# Patient Record
Sex: Female | Born: 1965 | Race: White | Hispanic: No | Marital: Married | Smoking: Former smoker
Health system: Southern US, Community
[De-identification: ages and names within clinical notes are randomized; demographics above are authoritative.]

## PROBLEM LIST (undated history)

## (undated) DIAGNOSIS — E162 Hypoglycemia, unspecified: Secondary | ICD-10-CM

## (undated) DIAGNOSIS — G8220 Paraplegia, unspecified: Secondary | ICD-10-CM

## (undated) HISTORY — DX: Paraplegia, unspecified: G82.20

## (undated) HISTORY — PX: BREAST LUMPECTOMY: SHX2

## (undated) HISTORY — PX: PARTIAL HYSTERECTOMY: SHX80

## (undated) HISTORY — PX: WISDOM TOOTH EXTRACTION: SHX21

## (undated) HISTORY — PX: SPINAL FIXATION SURGERY: SHX1055

## (undated) HISTORY — PX: THORACOTOMY: SUR1349

## (undated) HISTORY — PX: CARPAL TUNNEL RELEASE: SHX101

## (undated) HISTORY — PX: KNEE ARTHROPLASTY: SHX992

## (undated) HISTORY — PX: TRIGGER FINGER RELEASE: SHX641

## (undated) HISTORY — DX: Hypoglycemia, unspecified: E16.2

## (undated) HISTORY — PX: HIP ARTHROSCOPY W/ LABRAL REPAIR: SHX1750

## (undated) HISTORY — PX: ROTATOR CUFF REPAIR: SHX139

## (undated) HISTORY — PX: ABDOMINAL SURGERY: SHX537

## (undated) HISTORY — PX: TONSILECTOMY/ADENOIDECTOMY WITH MYRINGOTOMY: SHX6125

---

## 2012-06-26 DIAGNOSIS — Z96659 Presence of unspecified artificial knee joint: Secondary | ICD-10-CM | POA: Insufficient documentation

## 2012-11-19 DIAGNOSIS — M9320 Osteochondritis dissecans of unspecified site: Secondary | ICD-10-CM | POA: Insufficient documentation

## 2013-03-11 DIAGNOSIS — M1712 Unilateral primary osteoarthritis, left knee: Secondary | ICD-10-CM | POA: Insufficient documentation

## 2013-03-11 DIAGNOSIS — M25562 Pain in left knee: Secondary | ICD-10-CM | POA: Insufficient documentation

## 2013-03-11 DIAGNOSIS — M21169 Varus deformity, not elsewhere classified, unspecified knee: Secondary | ICD-10-CM | POA: Insufficient documentation

## 2014-03-25 DIAGNOSIS — M1990 Unspecified osteoarthritis, unspecified site: Secondary | ICD-10-CM | POA: Insufficient documentation

## 2014-04-08 DIAGNOSIS — Z96652 Presence of left artificial knee joint: Secondary | ICD-10-CM | POA: Insufficient documentation

## 2014-07-15 DIAGNOSIS — M25511 Pain in right shoulder: Secondary | ICD-10-CM | POA: Insufficient documentation

## 2015-05-27 DIAGNOSIS — M25552 Pain in left hip: Secondary | ICD-10-CM | POA: Insufficient documentation

## 2015-05-27 DIAGNOSIS — M7050 Other bursitis of knee, unspecified knee: Secondary | ICD-10-CM | POA: Insufficient documentation

## 2016-07-18 DIAGNOSIS — Z471 Aftercare following joint replacement surgery: Secondary | ICD-10-CM | POA: Insufficient documentation

## 2018-09-10 DIAGNOSIS — M19041 Primary osteoarthritis, right hand: Secondary | ICD-10-CM | POA: Insufficient documentation

## 2019-03-02 DIAGNOSIS — T1490XA Injury, unspecified, initial encounter: Secondary | ICD-10-CM | POA: Insufficient documentation

## 2019-03-02 DIAGNOSIS — S22049D Unspecified fracture of fourth thoracic vertebra, subsequent encounter for fracture with routine healing: Secondary | ICD-10-CM | POA: Insufficient documentation

## 2019-03-03 DIAGNOSIS — S22039A Unspecified fracture of third thoracic vertebra, initial encounter for closed fracture: Secondary | ICD-10-CM | POA: Insufficient documentation

## 2019-03-03 DIAGNOSIS — S2242XA Multiple fractures of ribs, left side, initial encounter for closed fracture: Secondary | ICD-10-CM | POA: Insufficient documentation

## 2019-03-13 DIAGNOSIS — R5381 Other malaise: Secondary | ICD-10-CM | POA: Insufficient documentation

## 2019-03-25 DIAGNOSIS — Z981 Arthrodesis status: Secondary | ICD-10-CM | POA: Insufficient documentation

## 2019-04-19 DIAGNOSIS — G8220 Paraplegia, unspecified: Secondary | ICD-10-CM | POA: Insufficient documentation

## 2019-05-20 DIAGNOSIS — R339 Retention of urine, unspecified: Secondary | ICD-10-CM | POA: Insufficient documentation

## 2019-10-28 ENCOUNTER — Encounter
Payer: BC Managed Care – PPO | Attending: Physical Medicine and Rehabilitation | Admitting: Physical Medicine and Rehabilitation

## 2019-10-28 ENCOUNTER — Other Ambulatory Visit: Payer: Self-pay

## 2019-10-28 ENCOUNTER — Encounter: Payer: Self-pay | Admitting: Physical Medicine and Rehabilitation

## 2019-10-28 VITALS — BP 109/74 | HR 102 | Temp 97.7°F | Ht 66.0 in | Wt 140.0 lb

## 2019-10-28 DIAGNOSIS — R252 Cramp and spasm: Secondary | ICD-10-CM | POA: Insufficient documentation

## 2019-10-28 DIAGNOSIS — M7918 Myalgia, other site: Secondary | ICD-10-CM | POA: Diagnosis present

## 2019-10-28 DIAGNOSIS — G8220 Paraplegia, unspecified: Secondary | ICD-10-CM | POA: Diagnosis present

## 2019-10-28 DIAGNOSIS — Z9359 Other cystostomy status: Secondary | ICD-10-CM | POA: Insufficient documentation

## 2019-10-28 DIAGNOSIS — N319 Neuromuscular dysfunction of bladder, unspecified: Secondary | ICD-10-CM | POA: Diagnosis present

## 2019-10-28 DIAGNOSIS — G8221 Paraplegia, complete: Secondary | ICD-10-CM | POA: Insufficient documentation

## 2019-10-28 DIAGNOSIS — K592 Neurogenic bowel, not elsewhere classified: Secondary | ICD-10-CM | POA: Diagnosis present

## 2019-10-28 MED ORDER — LIDOCAINE 5 % EX PTCH: 3.0000 | MEDICATED_PATCH | Freq: Two times a day (BID) | CUTANEOUS | 5 refills | Status: AC

## 2019-10-28 MED ORDER — DULOXETINE HCL 60 MG PO CPEP: 60.0000 mg | ORAL_CAPSULE | Freq: Every day | ORAL | 5 refills | Status: DC
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
## 2019-11-01 ENCOUNTER — Encounter (HOSPITAL_BASED_OUTPATIENT_CLINIC_OR_DEPARTMENT_OTHER): Payer: BC Managed Care – PPO | Admitting: Physical Medicine and Rehabilitation

## 2019-11-01 ENCOUNTER — Other Ambulatory Visit: Payer: Self-pay

## 2019-11-01 ENCOUNTER — Encounter: Payer: Self-pay | Admitting: Physical Medicine and Rehabilitation

## 2019-11-01 VITALS — BP 123/75 | HR 78 | Temp 97.7°F

## 2019-11-01 DIAGNOSIS — G8220 Paraplegia, unspecified: Secondary | ICD-10-CM | POA: Diagnosis not present

## 2019-11-01 DIAGNOSIS — M7918 Myalgia, other site: Secondary | ICD-10-CM | POA: Diagnosis not present
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
## 2019-11-01 NOTE — Patient Instructions (Signed)
Injected 6cc Using 1% Lidocaine with no EPI  Upper traps  B/L Levators B/L Posterior scalenes L only Middle scalenes Splenius Capitus Pectoralis Major Rhomboids B/L Infraspinatus Teres Major/minor Thoracic paraspinals x3 on each side Lumbar paraspinals Other injections-    Patient's level of pain prior was 3/10 Current level of pain after injections is- neck moves a little better- pain about the same  There was no bleeding or complications.  Patient was advised to drink a lot of water on day after injections to flush system Will have increased soreness for 12-48 hours after injections.  Can use Lidocaine patches the day AFTER injections Can use theracane on day of injections in places didn't inject Can use heating pad or cool/pad 4-6 hours AFTER injections - Discussed myofascial release for pecs- pulling up and inwards at different times.  - Order theracane- tennis balls. Can use 2-4 minutes against back of w/c.   -f/u 4 weeks

## 2019-11-29 ENCOUNTER — Encounter: Payer: BC Managed Care – PPO | Admitting: Physical Medicine and Rehabilitation

## 2019-12-04 ENCOUNTER — Other Ambulatory Visit: Payer: Self-pay

## 2019-12-04 ENCOUNTER — Encounter
Payer: BC Managed Care – PPO | Attending: Physical Medicine and Rehabilitation | Admitting: Physical Medicine and Rehabilitation

## 2019-12-04 ENCOUNTER — Encounter: Payer: Self-pay | Admitting: Physical Medicine and Rehabilitation

## 2019-12-04 VITALS — BP 117/79 | HR 103 | Temp 97.2°F | Wt 150.0 lb

## 2019-12-04 DIAGNOSIS — R252 Cramp and spasm: Secondary | ICD-10-CM

## 2019-12-04 DIAGNOSIS — K592 Neurogenic bowel, not elsewhere classified: Secondary | ICD-10-CM | POA: Diagnosis present

## 2019-12-04 DIAGNOSIS — M7918 Myalgia, other site: Secondary | ICD-10-CM | POA: Diagnosis present

## 2019-12-04 DIAGNOSIS — G950 Syringomyelia and syringobulbia: Secondary | ICD-10-CM | POA: Diagnosis not present

## 2019-12-04 DIAGNOSIS — N319 Neuromuscular dysfunction of bladder, unspecified: Secondary | ICD-10-CM

## 2019-12-04 DIAGNOSIS — G8220 Paraplegia, unspecified: Secondary | ICD-10-CM | POA: Diagnosis present

## 2019-12-04 DIAGNOSIS — Z9359 Other cystostomy status: Secondary | ICD-10-CM

## 2019-12-04 MED ORDER — BACLOFEN 20 MG PO TABS: 40.0000 mg | ORAL_TABLET | Freq: Four times a day (QID) | ORAL | 11 refills | Status: DC

## 2019-12-04 NOTE — Patient Instructions (Signed)
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   1. MRI of cervical and thoracic and lumbar with and without contrast of spinal cord to look for syrinx- went over my concerns and concerned about compression  Vs.  Syrinx. Esp due to abrupt worsening of Sxs' explained trigger point injections are NOT cause- will fade within 1 day at most.      2. Call me if any concerns- and ask for [facility] if need me immediately.  3. Can increase Baclofen up to 160 mg/day- max- so that gives 40 mg (20 mg can be split to 2 -10mg - so basically 4 extra pills/day as needed) in addition to normal dosing.  - will wait on adding Zanaflex for increased spasticity.   4. Keens' shoes- for enlarged toe box.   5. Try 1/2 glass wine first, then can increase to max of 1 glass of wine at a time.   6. F/U 3-4 weeks to make sure nothing gets lost.
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
## 2019-12-06 ENCOUNTER — Telehealth: Payer: Self-pay | Admitting: *Deleted

## 2019-12-06 NOTE — Telephone Encounter (Signed)
Ms Bracey called and said they cannot get her in for the MRI until the 29th.  She is wondering if she should keep that appt or if it should be scheduled somewhere else. Please advise.

## 2019-12-17 NOTE — Telephone Encounter (Signed)
I would keep 1/29 unless her symptoms get substantially worse, then please call back and will try to get her in somewhere else earlier.  Thank you

## 2019-12-20 ENCOUNTER — Other Ambulatory Visit (HOSPITAL_COMMUNITY): Payer: BC Managed Care – PPO

## 2019-12-20 ENCOUNTER — Ambulatory Visit (HOSPITAL_COMMUNITY): Payer: BC Managed Care – PPO

## 2019-12-20 ENCOUNTER — Ambulatory Visit: Payer: BC Managed Care – PPO | Admitting: Physical Medicine and Rehabilitation

## 2019-12-23 ENCOUNTER — Telehealth: Payer: Self-pay | Admitting: *Deleted

## 2019-12-23 MED ORDER — DIAZEPAM 10 MG PO TABS: ORAL_TABLET | ORAL | 0 refills | Status: DC

## 2019-12-23 NOTE — Telephone Encounter (Signed)
Patient left a message stating that after further review she would like a sedative for her upcoming procedure (MRI).  She was told it would be a 3 hour procedure.  I spoke with Dr. Berline Chough, she agreed.  I notified patient and phoned I 1 tablet of diazepam 10 mg to patients preferred pharmacy.

## 2019-12-24 ENCOUNTER — Encounter (HOSPITAL_COMMUNITY): Payer: Self-pay

## 2019-12-24 ENCOUNTER — Ambulatory Visit (HOSPITAL_COMMUNITY)
Admission: RE | Admit: 2019-12-24 | Discharge: 2019-12-24 | Disposition: A | Discharge status: Home / Self Care | Payer: BC Managed Care – PPO | Source: Ambulatory Visit | Attending: Physical Medicine and Rehabilitation | Admitting: Physical Medicine and Rehabilitation

## 2019-12-24 ENCOUNTER — Other Ambulatory Visit: Payer: Self-pay

## 2019-12-24 ENCOUNTER — Ambulatory Visit (HOSPITAL_COMMUNITY): Payer: BC Managed Care – PPO

## 2019-12-24 DIAGNOSIS — G950 Syringomyelia and syringobulbia: Secondary | ICD-10-CM | POA: Diagnosis not present

## 2019-12-24 MED ORDER — GADOBUTROL 1 MMOL/ML IV SOLN
7.5000 mL | Freq: Once | INTRAVENOUS | Status: AC | PRN
  Administered 2019-12-24: 7.5 mL via INTRAVENOUS

## 2019-12-27 ENCOUNTER — Other Ambulatory Visit: Payer: Self-pay

## 2019-12-27 ENCOUNTER — Telehealth: Payer: Self-pay

## 2019-12-27 ENCOUNTER — Encounter
Payer: BC Managed Care – PPO | Attending: Physical Medicine and Rehabilitation | Admitting: Physical Medicine and Rehabilitation

## 2019-12-27 ENCOUNTER — Encounter: Payer: Self-pay | Admitting: Physical Medicine and Rehabilitation

## 2019-12-27 VITALS — BP 117/79 | HR 96 | Temp 97.7°F

## 2019-12-27 DIAGNOSIS — K592 Neurogenic bowel, not elsewhere classified: Secondary | ICD-10-CM | POA: Diagnosis present

## 2019-12-27 DIAGNOSIS — N319 Neuromuscular dysfunction of bladder, unspecified: Secondary | ICD-10-CM | POA: Insufficient documentation

## 2019-12-27 DIAGNOSIS — M7918 Myalgia, other site: Secondary | ICD-10-CM | POA: Insufficient documentation

## 2019-12-27 DIAGNOSIS — G8220 Paraplegia, unspecified: Secondary | ICD-10-CM | POA: Diagnosis present

## 2019-12-27 DIAGNOSIS — Z9359 Other cystostomy status: Secondary | ICD-10-CM | POA: Diagnosis present

## 2019-12-27 DIAGNOSIS — G950 Syringomyelia and syringobulbia: Secondary | ICD-10-CM | POA: Insufficient documentation

## 2019-12-27 DIAGNOSIS — R252 Cramp and spasm: Secondary | ICD-10-CM | POA: Diagnosis present

## 2019-12-27 DIAGNOSIS — M792 Neuralgia and neuritis, unspecified: Secondary | ICD-10-CM

## 2019-12-27 MED ORDER — PREGABALIN 50 MG PO CAPS: 50.0000 mg | ORAL_CAPSULE | Freq: Two times a day (BID) | ORAL | 0 refills | Status: DC

## 2019-12-27 MED ORDER — PREGABALIN 50 MG PO CAPS: 50.0000 mg | ORAL_CAPSULE | Freq: Two times a day (BID) | ORAL | 3 refills | Status: DC

## 2019-12-27 MED ORDER — PREGABALIN 100 MG PO CAPS: 100.0000 mg | ORAL_CAPSULE | Freq: Two times a day (BID) | ORAL | 3 refills | Status: DC
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
## 2019-12-27 NOTE — Telephone Encounter (Signed)
Lyrica, insurance only allows max 3 tablets daily.

## 2019-12-27 NOTE — Addendum Note (Signed)
Addended by: Genice Rouge on: 12/27/2019 03:07 PM   Modules accepted: Orders

## 2019-12-27 NOTE — Telephone Encounter (Signed)
I did it- thanks

## 2019-12-27 NOTE — Progress Notes (Signed)
Subjective:    Patient ID: Angela Gilmore, adult    DOB: 03-28-1966, 54 y.o.   MRN: [id number]  HPI  Started 6 weeks ago: Rectangle of pain that starts in upper neck/back area below C7 and extends down to below scapulae.  If keeps pressure on it, it extends down legs to bottom of feet.   Will draw up around front of abdomen and under breasts.    Can't even lay on back in bed anymore.    3 episodes where heart rate has gone up to 160.   MRI results gone over with pt and husband and except for SCI, no issues   Has come off gabapentin- off gabapentin completely now. Cymbalta is now 60 mg daily- (30 to 60 mg now) Was on Gabapentin  Was 400 mg 3x/day   Tramadol doesn't help these symptoms that's she's having.   [facility] did power w/c.       Pain Inventory Average Pain 7 Pain Right Now 5 My pain is constant  In the last 24 hours, has pain interfered with the following? General activity 7 Relation with others 7 Enjoyment of life 0 What TIME of day is your pain at its worst? all Sleep (in general) Fair  Pain is worse with: unsure Pain improves with: medication Relief from Meds: 1  Mobility do you drive?  no use a wheelchair needs help with transfers Do you have any goals in this area?  yes  Function disabled: date disabled . I need assistance with the following:  dressing, bathing, toileting, meal prep, household duties and shopping  Neuro/Psych numbness spasms  Prior Studies Any changes since last visit?  no  Physicians involved in your care Any changes since last visit?  no   Family History  Problem Relation Age of Onset  . Heart disease Father   . Emphysema Father   . Breast cancer Sister   . Mental illness Sister   . Stroke Brother   . Mental illness Brother    Social History   Socioeconomic History  . Marital status: Unknown    Spouse name: Not on file  . Number of children: Not on file  . Years of education: Not on file    . Highest education level: Not on file  Occupational History  . Not on file  Tobacco Use  . Smoking status: Former Games developer  . Smokeless tobacco: Never Used  Substance and Sexual Activity  . Alcohol use: Not Currently  . Drug use: Never  . Sexual activity: Not on file  Other Topics Concern  . Not on file  Social History Narrative  . Not on file   Social Determinants of Health   Financial Resource Strain:   . Difficulty of Paying Living Expenses: Not on file  Food Insecurity:   . Worried About Programme researcher, broadcasting/film/video in the Last Year: Not on file  . Ran Out of Food in the Last Year: Not on file  Transportation Needs:   . Lack of Transportation (Medical): Not on file  . Lack of Transportation (Non-Medical): Not on file  Physical Activity:   . Days of Exercise per Week: Not on file  . Minutes of Exercise per Session: Not on file  Stress:   . Feeling of Stress : Not on file  Social Connections:   . Frequency of Communication with Friends and Family: Not on file  . Frequency of Social Gatherings with Friends and Family: Not on file  . Attends  Religious Services: Not on file  . Active Member of Clubs or Organizations: Not on file  . Attends Archivist Meetings: Not on file  . Marital Status: Not on file   Past Surgical History:  Procedure Laterality Date  . ABDOMINAL SURGERY    . BREAST LUMPECTOMY    . CARPAL TUNNEL RELEASE    . HIP ARTHROSCOPY W/ LABRAL REPAIR    . KNEE ARTHROPLASTY    . PARTIAL HYSTERECTOMY    . ROTATOR CUFF REPAIR    . SPINAL FIXATION SURGERY    . THORACOTOMY    . TONSILECTOMY/ADENOIDECTOMY WITH MYRINGOTOMY    . TRIGGER FINGER RELEASE    . WISDOM TOOTH EXTRACTION     Past Medical History:  Diagnosis Date  . Hypoglycemia   . Paraplegia (HCC)    BP 117/79   Pulse 96   Temp 97.7 F (36.5 C)   SpO2 99%   Opioid Risk Score:   Fall Risk Score:  `1  Depression screen PHQ 2/9  No flowsheet data found.  Review of Systems   Constitutional: Negative.   HENT: Negative.   Eyes: Negative.   Respiratory: Negative.   Cardiovascular: Negative.   Gastrointestinal: Negative.   Endocrine: Negative.   Genitourinary: Negative.   Musculoskeletal: Positive for back pain.       Spasms  Skin: Negative.   Allergic/Immunologic: Negative.   Neurological: Positive for numbness.  Hematological: Negative.   Psychiatric/Behavioral: Negative.   All other systems reviewed and are negative.      Objective:   Physical Exam  Can't stay still; in power w/c; sitting forward so can't push back again back of w/c.           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 With increased nerve pain.     1. Lyrica 50 mg 2x/day x 3-4 days; then 100 mg 2x/day x 3-4 days; then 100 mg 3x/day if 2x/day not enough-Gave rx with 3 RFs 2. Continue Cymbalta at 60 mg-   3. Goal to get a manual w/c and table bicycle for arms.  If needed, suggest [facility] or [employer].  4.  If Lyrica doesn't work within 7-10 days, talk about Gabapentin dose again.  5. Still on Midodrine- for elevating BP. Continue medicine  6. Racing/elevated heart rate is NOT due to SCI- suggest d/w PCP- concerned if it's heading towards 140-160- could have A Fib (atrial fibrillation) or SVT- supraventricular tachycardia-  However they aren't related to SCI issues- SCI could cause low BP, high BP or low heart rate, not high heart rate.  7. F/U in 8-10  weeks.    I spent a total of 35 minutes on appointment- 25 minutes on d/w nerve pain and making connection with gabapentin- also educated pt on racing heart rate.

## 2019-12-27 NOTE — Telephone Encounter (Signed)
Send two different scripts in order for insurance to pay for it.

## 2020-03-06 ENCOUNTER — Encounter: Payer: Self-pay | Admitting: Physical Medicine and Rehabilitation

## 2020-03-06 ENCOUNTER — Other Ambulatory Visit: Payer: Self-pay

## 2020-03-06 ENCOUNTER — Encounter
Payer: BC Managed Care – PPO | Attending: Physical Medicine and Rehabilitation | Admitting: Physical Medicine and Rehabilitation

## 2020-03-06 VITALS — BP 116/79 | HR 95 | Temp 97.5°F

## 2020-03-06 DIAGNOSIS — L679 Hair color and hair shaft abnormality, unspecified: Secondary | ICD-10-CM | POA: Diagnosis not present

## 2020-03-06 DIAGNOSIS — K592 Neurogenic bowel, not elsewhere classified: Secondary | ICD-10-CM | POA: Insufficient documentation

## 2020-03-06 DIAGNOSIS — G950 Syringomyelia and syringobulbia: Secondary | ICD-10-CM | POA: Insufficient documentation

## 2020-03-06 DIAGNOSIS — Z9359 Other cystostomy status: Secondary | ICD-10-CM | POA: Diagnosis present

## 2020-03-06 DIAGNOSIS — R252 Cramp and spasm: Secondary | ICD-10-CM | POA: Insufficient documentation

## 2020-03-06 DIAGNOSIS — N319 Neuromuscular dysfunction of bladder, unspecified: Secondary | ICD-10-CM | POA: Insufficient documentation

## 2020-03-06 DIAGNOSIS — G8220 Paraplegia, unspecified: Secondary | ICD-10-CM | POA: Insufficient documentation

## 2020-03-06 DIAGNOSIS — M7918 Myalgia, other site: Secondary | ICD-10-CM | POA: Diagnosis present

## 2020-03-06 DIAGNOSIS — E559 Vitamin D deficiency, unspecified: Secondary | ICD-10-CM

## 2020-03-06 MED ORDER — GABAPENTIN 400 MG PO CAPS: 400.0000 mg | ORAL_CAPSULE | Freq: Three times a day (TID) | ORAL | 11 refills | Status: DC
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
## 2020-03-06 NOTE — Patient Instructions (Signed)
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 With increased nerve pain.   1. Suggest calling Gynecology about nipple soreness- and possible perimenopause.   2. Will check Vit D and TSH levels to see if has deficiency that could be causing her hair to fall out and nails to break.   3. Called [facility]- [facility]- about an outdoor w/c - need to do for T2/3 paraplegia  4. Get in pool and do elevated flower beds and discussed walking..   5. Refilled Gabapentin 400 mg TID with 11 RFs.   6. Don't change bowel program to q2 days since increases risk of AD and accidents.   7. Will give her name for Dr Mindi Slicker- psychology. Comp Rehab at [facility].    8. Driving- hand controls- look online- they are GREAT. Try out in empty parking lot first.  Then back roads.   9. F/U- 2 months- can get digital stimulation stick and suppository inserter from [employer].  Can live on your own at her level!

## 2020-03-07 LAB — VITAMIN D 25 HYDROXY (VIT D DEFICIENCY, FRACTURES): Vit D, 25-Hydroxy: 29.6 ng/mL — ABNORMAL LOW (ref 30.0–100.0)

## 2020-03-07 LAB — TSH: TSH: 3.2 u[IU]/mL (ref 0.450–4.500)

## 2020-04-01 ENCOUNTER — Telehealth: Payer: Self-pay | Admitting: *Deleted

## 2020-04-01 NOTE — Telephone Encounter (Signed)
Angela Gilmore called to let us know her Vit D level that was drawn in April was rejected by insurance due to coding. [name] says to remind Dr Berline Chough that it was due to lines in her nails and suspected deficiency.

## 2020-04-01 NOTE — Telephone Encounter (Signed)
Please put a note on my desk, so when I get back next week, I can deal with it.  I am gone, and unfortunately, now have multiple messages to deal with, that I cannot deal with while off/home.  Thank you, ML

## 2020-04-10 ENCOUNTER — Telehealth: Payer: Self-pay

## 2020-04-10 NOTE — Telephone Encounter (Signed)
Per patient you have to bill for Vitamin D deficiency not hair/ nail follicle issue>  Left message last week asking the code be changed and [facility] be rebilled -- the fax number for [facility] [phone] ---can call [facility] and do verball also at  [phone]  Her number is [phone] if ? From 04.16.21 visit

## 2020-04-21 ENCOUNTER — Telehealth: Payer: Self-pay

## 2020-04-21 NOTE — Telephone Encounter (Signed)
Patient called again regarding assitance with the documentation for this lab. Sent email to [facility]

## 2020-04-22 NOTE — Telephone Encounter (Signed)
Have changed dx in note from 4/16- to Vit D Deficiency- sorry had put hair loss/def for both TSH AND Vit D- changed that to Vit D def for Vit D lab-  Thanks, ML

## 2020-05-01 ENCOUNTER — Other Ambulatory Visit: Payer: Self-pay

## 2020-05-01 ENCOUNTER — Encounter: Payer: Self-pay | Admitting: Physical Medicine and Rehabilitation

## 2020-05-01 ENCOUNTER — Encounter
Payer: BC Managed Care – PPO | Attending: Physical Medicine and Rehabilitation | Admitting: Physical Medicine and Rehabilitation

## 2020-05-01 VITALS — BP 113/77 | HR 98 | Temp 97.2°F | Ht 66.0 in | Wt 160.0 lb

## 2020-05-01 DIAGNOSIS — N319 Neuromuscular dysfunction of bladder, unspecified: Secondary | ICD-10-CM | POA: Diagnosis present

## 2020-05-01 DIAGNOSIS — G950 Syringomyelia and syringobulbia: Secondary | ICD-10-CM | POA: Insufficient documentation

## 2020-05-01 DIAGNOSIS — Z79891 Long term (current) use of opiate analgesic: Secondary | ICD-10-CM

## 2020-05-01 DIAGNOSIS — R252 Cramp and spasm: Secondary | ICD-10-CM | POA: Diagnosis present

## 2020-05-01 DIAGNOSIS — G8220 Paraplegia, unspecified: Secondary | ICD-10-CM | POA: Diagnosis present

## 2020-05-01 DIAGNOSIS — Z5181 Encounter for therapeutic drug level monitoring: Secondary | ICD-10-CM

## 2020-05-01 DIAGNOSIS — Z9359 Other cystostomy status: Secondary | ICD-10-CM

## 2020-05-01 DIAGNOSIS — K592 Neurogenic bowel, not elsewhere classified: Secondary | ICD-10-CM | POA: Diagnosis present

## 2020-05-01 DIAGNOSIS — G8921 Chronic pain due to trauma: Secondary | ICD-10-CM | POA: Diagnosis not present

## 2020-05-01 DIAGNOSIS — M7918 Myalgia, other site: Secondary | ICD-10-CM

## 2020-05-01 DIAGNOSIS — L679 Hair color and hair shaft abnormality, unspecified: Secondary | ICD-10-CM | POA: Diagnosis present

## 2020-05-01 MED ORDER — TRAMADOL HCL 50 MG PO TABS: 50.0000 mg | ORAL_TABLET | Freq: Three times a day (TID) | ORAL | 5 refills | Status: DC | PRN

## 2020-05-01 MED ORDER — DULOXETINE HCL 30 MG PO CPEP: 30.0000 mg | ORAL_CAPSULE | Freq: Every day | ORAL | 11 refills | Status: DC

## 2020-05-01 NOTE — Patient Instructions (Signed)
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   1. Decrease Duloxetine to 30 mg daily- since wasn't as helpful as hoped.   2. If pain gets worse when decreases dose, can go back to 60 mg daily- just let me know.   3. Start Tramadol- has been on before from PCP- but Rx was less- suggest starting 50 mg 3x/day as needed- take if pain >3/10- don't let pain get out of control.  Can take with Tylenol to extend length of tramadol.  Will wait on Norco- since doesn't help nerve pain.  Write for tramadol 50 mg 3x/day as needed #90- 5 RFs -will need opiate oral screen due to SCI and opiate contract  4. Needs to take Over the counter- 5000 units daily. Vit D.   5. Will recheck TSH in 6 months- since 3.2 at this time- and possibly heading upwards.   6. Tramadol can help spasticity- by helping pain.   7. F/U 3 months

## 2020-05-01 NOTE — Addendum Note (Signed)
Addended by: Doreene Eland on: 05/01/2020 11:36 AM   Modules accepted: Orders

## 2020-05-01 NOTE — Progress Notes (Signed)
Subjective:    Patient ID: Angela Gilmore, adult    DOB: [birth year], 54 y.o.   MRN: [id number]  HPI    Pain still an issue.  No change in nerve pain with Duloxetine.  Wants to  decrease nerve pain.    Used to be on Norco 10 mg/325 mg- So every 4 hours, took Norco and Tramadol 1 tab with tylenol.  In past- quit taking them ~ when came form hospital.   Doesn't see anyone/Ortho for low back pain anymore- had low back pain prior to injury.   Still taking Tramadol- from PCP- just when hurting, takes Tramadol - on average 1x/day-  Waits to take pain meds til pretty bad- up to 5-8/10 before takes pain meds.    Manual w/c  Due ot be done in next 3 weeks.  Got from [facility].    Troubles with feet- needed surgery on feet- cutting toenails and 2nd one got infected and needed surgery- toenails set off spasms and they go crazy.     Pain Inventory Average Pain 5 Pain Right Now 3 My pain is constant, burning and tingling  In the last 24 hours, has pain interfered with the following? General activity 9 Relation with others 7 Enjoyment of life 7 What TIME of day is your pain at its worst? all, worse at night Sleep (in general) has no trouble going to sleep but pain wakes her up  Pain is worse with: anything Pain improves with: nothing really. sometimes position change helps for short time Relief from Meds: 0  Mobility use a wheelchair needs help with transfers  Function disabled: date disabled . I need assistance with the following:  dressing, bathing, toileting, meal prep, household duties and shopping  Neuro/Psych numbness tingling spasms  Prior Studies Any changes since last visit?  no  Physicians involved in your care Any changes since last visit?  no   Family History  Problem Relation Age of Onset  . Heart disease Father   . Emphysema Father   . Breast cancer Sister   . Mental illness Sister   . Stroke Brother   . Mental illness Brother     Social History   Socioeconomic History  . Marital status: Unknown    Spouse name: Not on file  . Number of children: Not on file  . Years of education: Not on file  . Highest education level: Not on file  Occupational History  . Not on file  Tobacco Use  . Smoking status: Former Games developer  . Smokeless tobacco: Never Used  Substance and Sexual Activity  . Alcohol use: Not Currently  . Drug use: Never  . Sexual activity: Not on file  Other Topics Concern  . Not on file  Social History Narrative  . Not on file   Social Determinants of Health   Financial Resource Strain:   . Difficulty of Paying Living Expenses:   Food Insecurity:   . Worried About Programme researcher, broadcasting/film/video in the Last Year:   . Barista in the Last Year:   Transportation Needs:   . Freight forwarder (Medical):   Marland Kitchen Lack of Transportation (Non-Medical):   Physical Activity:   . Days of Exercise per Week:   . Minutes of Exercise per Session:   Stress:   . Feeling of Stress :   Social Connections:   . Frequency of Communication with Friends and Family:   . Frequency of Social Gatherings with Friends and  Family:   . Attends Religious Services:   . Active Member of Clubs or Organizations:   . Attends Archivist Meetings:   Marland Kitchen Marital Status:    Past Surgical History:  Procedure Laterality Date  . ABDOMINAL SURGERY    . BREAST LUMPECTOMY    . CARPAL TUNNEL RELEASE    . HIP ARTHROSCOPY W/ LABRAL REPAIR    . KNEE ARTHROPLASTY    . PARTIAL HYSTERECTOMY    . ROTATOR CUFF REPAIR    . SPINAL FIXATION SURGERY    . THORACOTOMY    . TONSILECTOMY/ADENOIDECTOMY WITH MYRINGOTOMY    . TRIGGER FINGER RELEASE    . WISDOM TOOTH EXTRACTION     Past Medical History:  Diagnosis Date  . Hypoglycemia   . Paraplegia (HCC)    BP 113/77   Pulse 98   Temp (!) 97.2 F (36.2 C)   Ht 5\' 6"  (1.676 m) Comment: pt reported  Wt 160 lb (72.6 kg) Comment: pt reorted-says she has gained since leaving hospital   SpO2 98%   BMI 25.82 kg/m   Opioid Risk Score:   Fall Risk Score:  `1  Depression screen PHQ 2/9  Depression screen PHQ 2/9 05/01/2020  Decreased Interest 0  Down, Depressed, Hopeless 0  PHQ - 2 Score 0   Review of Systems  Constitutional: Negative.   HENT: Negative.   Eyes: Negative.   Respiratory: Negative.   Cardiovascular:       Fluid retention vs weight gain  Gastrointestinal:       Bowel program  Endocrine: Negative.   Genitourinary:       Suprapubic catheter  Musculoskeletal: Positive for back pain and myalgias.  Skin: Negative.   Allergic/Immunologic: Negative.   Neurological: Positive for numbness.  Hematological: Negative.   Psychiatric/Behavioral: Negative.   All other systems reviewed and are negative.      Objective:   Physical Exam Awake, alert, appropriate, accompanied by husband, NAD Has suprapubic in place- anchored R thigh Mild spasticity/tone with LE ROM- MAS of 1+ currently- not in pain.      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   1. Decrease Duloxetine to 30 mg daily- since wasn't as helpful as hoped.   2. If pain gets worse when decreases dose, can go back to 60 mg daily- just let me know.   3. Start Tramadol- has been on before from PCP- but Rx was less- suggest starting 50 mg 3x/day as needed- take if pain >3/10- don't let pain get out of control.  Can take with Tylenol to extend length of tramadol.  Will wait on Norco- since doesn't help nerve pain.  Write for tramadol 50 mg 3x/day as needed #90- 5 RFs -will need opiate oral screen due to SCI and opiate contract  4. Needs to take Over the counter- 5000 units daily. Vit D.   5. Will recheck TSH in 6 months- since 3.2 at this time- and possibly heading upwards.   6. Tramadol can help spasticity- by helping  pain.   7. F/U 3 months  I spent a total of 30 minutes on visit- as detailed above.

## 2020-05-08 LAB — DRUG TOX MONITOR 1 W/CONF, ORAL FLD

## 2020-05-08 LAB — DRUG TOX ALC METAB W/CON, ORAL FLD: Alcohol Metabolite: NEGATIVE ng/mL (ref <25)

## 2020-05-11 ENCOUNTER — Telehealth: Payer: Self-pay | Admitting: *Deleted

## 2020-05-11 NOTE — Telephone Encounter (Signed)
Oral swab drug screen was consistent for having no controlled medications present. 

## 2020-05-19 ENCOUNTER — Telehealth: Payer: Self-pay | Admitting: *Deleted

## 2020-05-19 NOTE — Telephone Encounter (Signed)
I know- she's texted me on mychart today as well - I know, but there's nothing I can do right now I gave her a script initially that she lost- so because of TAX reasons, she won't stop calling- and since it's not the end of the year, it's NOT an emergency- I will do it, when I have ACCESS to a script pad- tomorrow. Thank you

## 2020-05-19 NOTE — Telephone Encounter (Signed)
Patient left a message stating that [facility] needs a script from Dr. Berline Chough ASAP regarding the custom build wheelchair.

## 2020-05-20 NOTE — Telephone Encounter (Signed)
Patient notified that script was sent.  She said thank you.  She then went on to say that she has contact [facility] and they still cannot process her labs. Apparently incorrect codes or something of that nature

## 2020-05-26 IMAGING — MR MR CERVICAL SPINE WO/W CM
10 of 13 series · 31 of 48 positions shown · IV contrast (gadavist)
Comparison: None.

CLINICAL DATA: History of prior thoracic spine surgery for spinal
cord injury. Increasing numbness across the spine and pain both
above and below the level of injury. Question syrinx cord
compression.

EXAM:
MRI CERVICAL SPINE WITHOUT AND WITH CONTRAST
TECHNIQUE: Multiplanar and multiecho pulse sequences of the cervical spine, to
include the craniocervical junction and cervicothoracic junction,
were obtained without and with intravenous contrast.
CONTRAST:  7.5 mL GADAVIST IV

[Series 16: T2 · sagittal · 3.0mm · 0.56mm/px · 2 of 15 slices shown (1 of 4)]
[im 1/15]
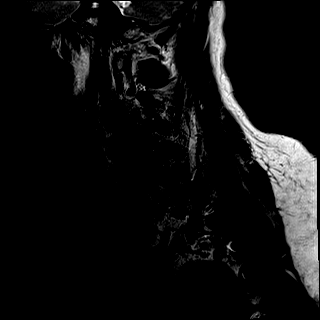
[im 15/15]
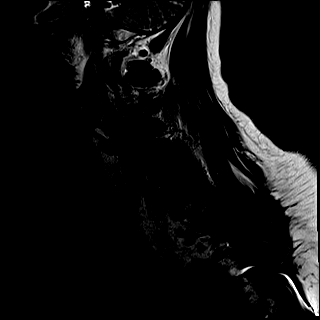

[Series 17: T1 · sagittal · 3.0mm · 0.56mm/px · 2 of 15 slices shown (1 of 3)]
[im 1/15]
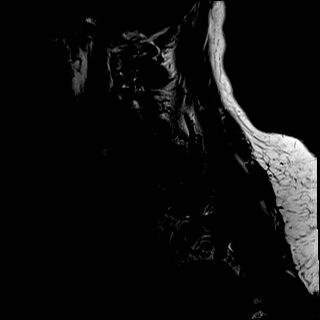
[im 15/15]
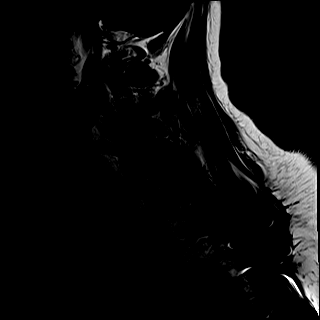

[Series 18: STIR · sagittal · 3.0mm · 0.70mm/px · 2 of 15 slices shown]
[im 1/15]
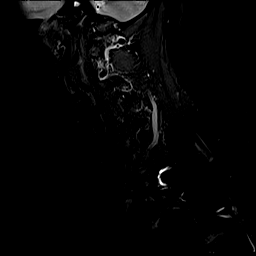
[im 15/15]
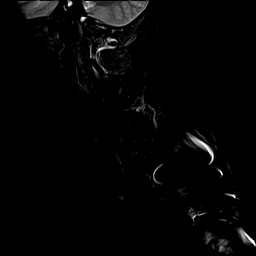

[Series 19: T2 · axial · 3.0mm · 0.66mm/px · z∈[-149,-52]mm · 5 of 35 slices shown (2 of 4)]
[im 1/35]
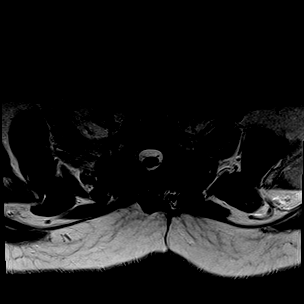
[im 9/35]
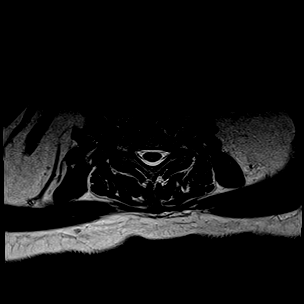
[im 18/35]
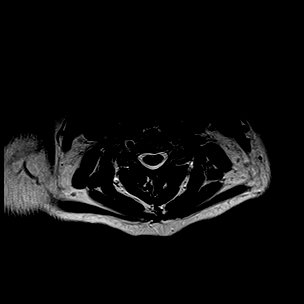
[im 26/35]
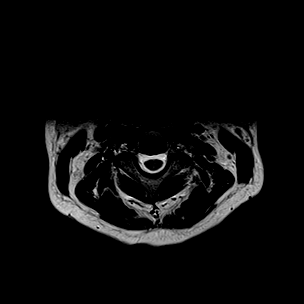
[im 35/35]
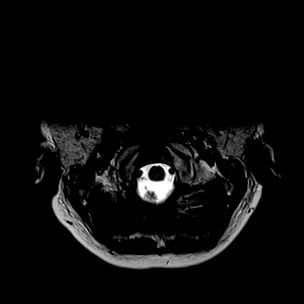

[Series 21: T1 · axial · 3.0mm · 0.35mm/px · z∈[-143,-47]mm · 5 of 35 slices shown (2 of 3)]
[im 1/35]
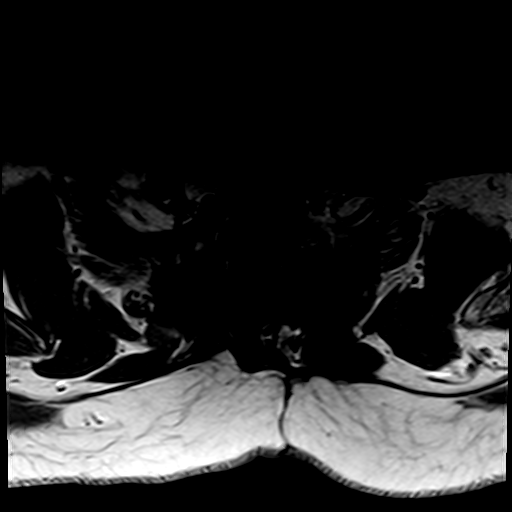
[im 9/35]
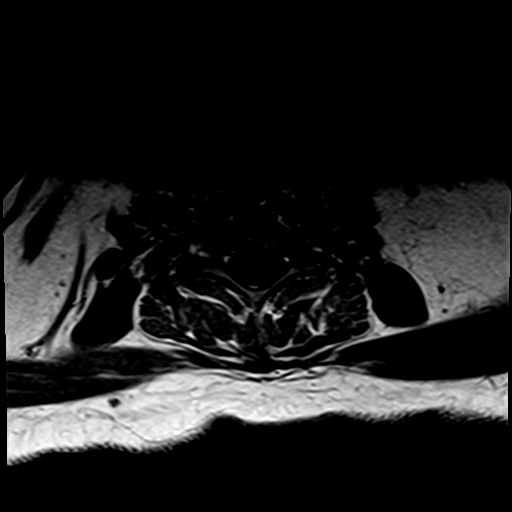
[im 18/35]
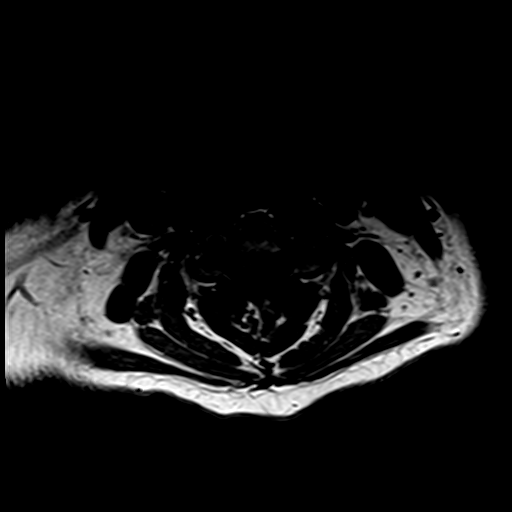
[im 26/35]
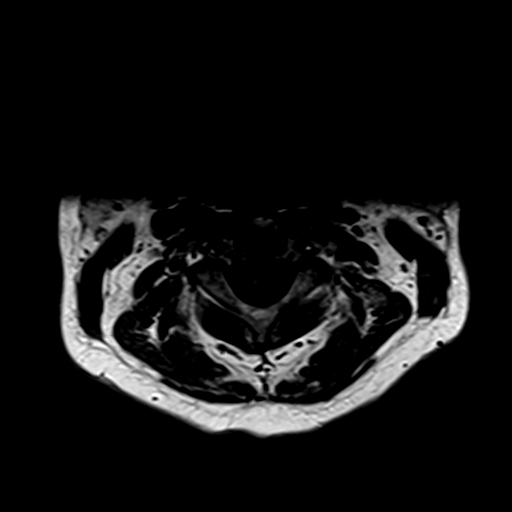
[im 35/35]
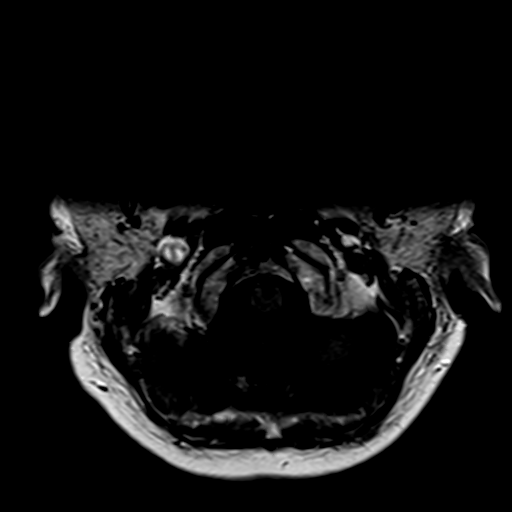

[Series 25: T2 · sagittal · 3.0mm · 0.76mm/px · 3 of 17 slices shown (3 of 4)]
[im 1/17]
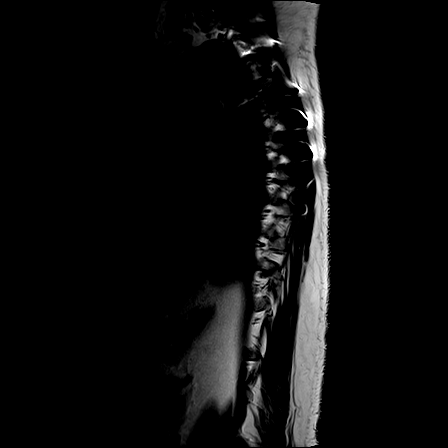
[im 9/17]
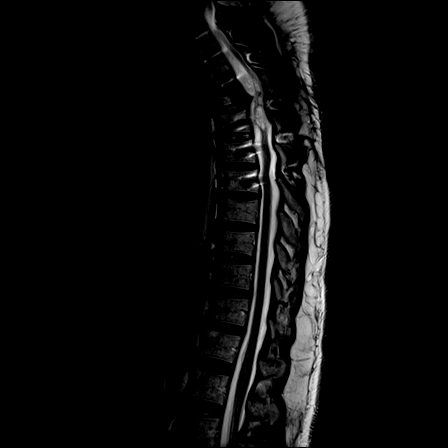
[im 17/17]
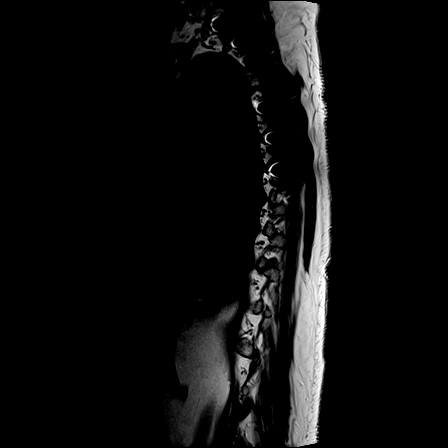

[Series 26: T1 · sagittal · 3.0mm · 0.76mm/px · 3 of 17 slices shown (3 of 3)]
[im 1/17]
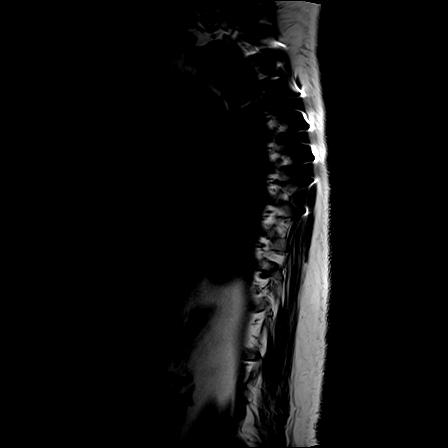
[im 9/17]
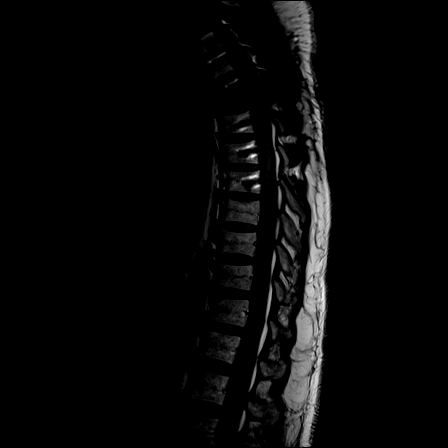
[im 17/17]
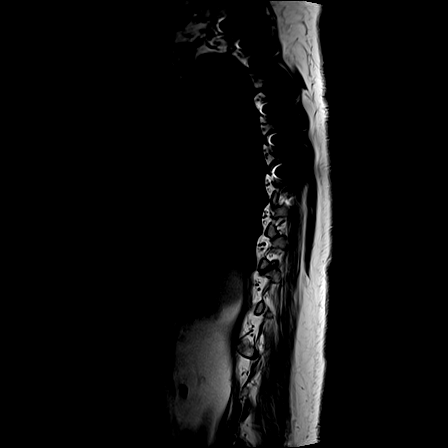

[Series 27: T2 · axial · 5.0mm · 0.59mm/px · z∈[-343,-98]mm · 6 of 37 slices shown (4 of 4)]
[im 1/37]
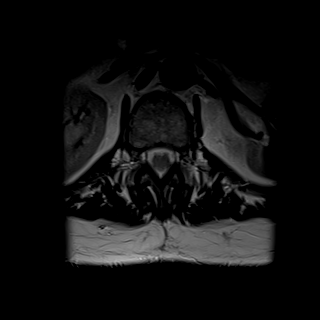
[im 8/37]
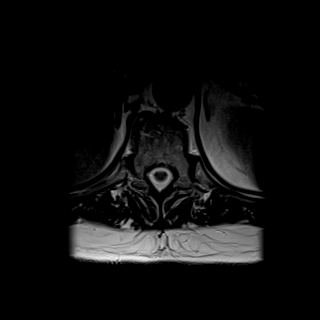
[im 15/37]
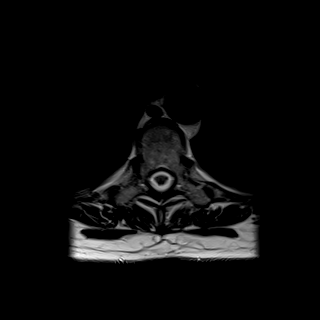
[im 22/37]
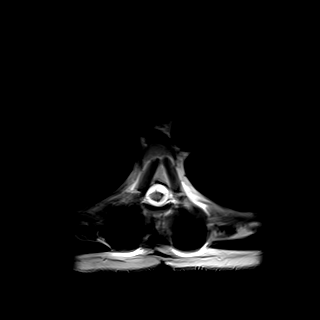
[im 29/37]
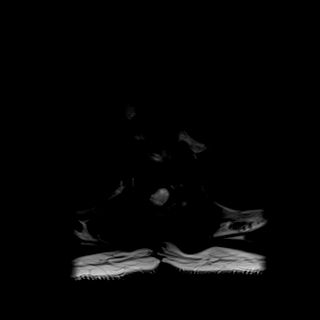
[im 37/37]
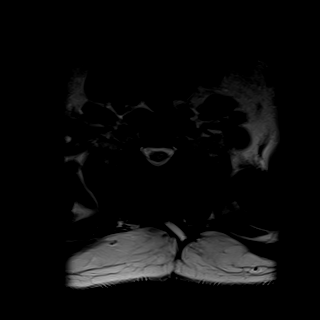

[Series 28: T1 post-contrast · sagittal · 3.0mm · 0.35mm/px · 2 of 15 slices shown (1 of 2)]
[im 1/15]
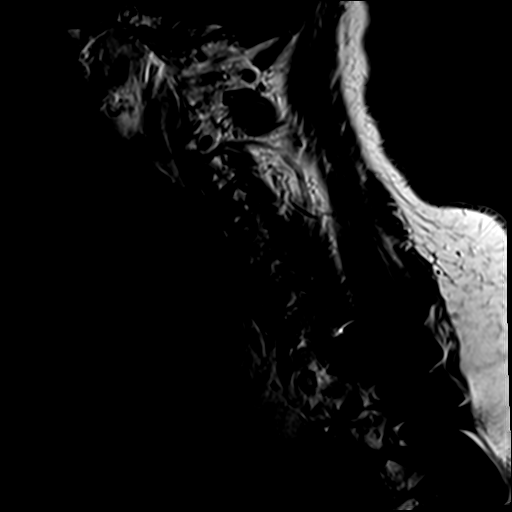
[im 15/15]
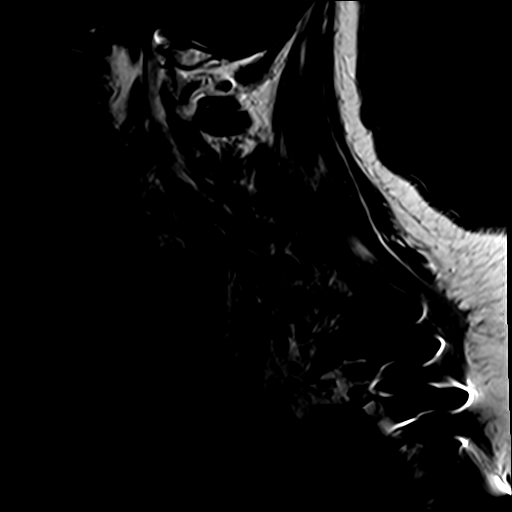

[Series 29: T1 post-contrast · axial · 3.0mm · 0.35mm/px · 1 of 35 slices shown (2 of 2)]
[im 1/35]
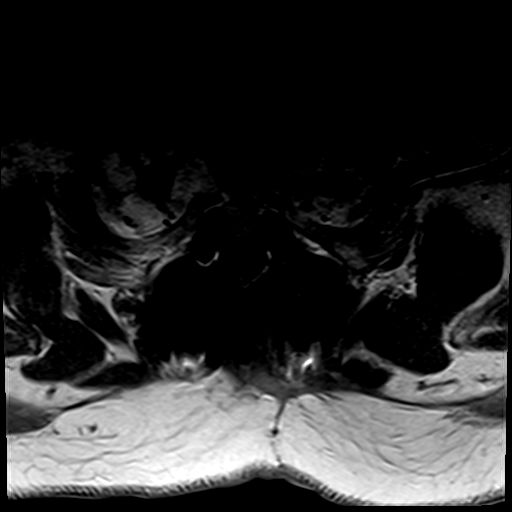

[31 of 48 positions shown; findings below may reference images not displayed]

FINDINGS: Alignment: Straightening of lordosis and trace anterolisthesis C3 on
C4.

Vertebrae: No fracture, evidence of discitis, or bone lesion.

Cord: Normal signal throughout. Negative for syrinx or pathologic
enhancement after contrast administration.

Posterior Fossa, vertebral arteries, paraspinal tissues: Negative.

Disc levels:

C2-3: Mild facet arthropathy. Otherwise negative.

C3-4: Mild posterior bony ridging and facet degenerative change
without stenosis.

C4-5: Shallow down turning central protrusion without stenosis.

C5-6: Negative.

C6-7: Shallow broad-based protrusion without stenosis.

C7-T1: Negative.
IMPRESSION: 1. Negative for syrinx or other finding to explain the patient's
symptoms.
2. Mild cervical degenerative disease without stenosis.

## 2020-05-27 ENCOUNTER — Telehealth: Payer: Self-pay

## 2020-05-27 NOTE — Telephone Encounter (Signed)
oN June 30 at 2:19 send lab rep the Diagnosis cod E55.9 for Vitamin D Deficiency and asked him to re bill claim - Today at 8:05 received voicemail to call [facility] and give them new code for labs.  Calling number [phone] to give new code also.  This bill is for the LABS not for the medical charges.[employer] and gave the lab code.

## 2020-07-01 ENCOUNTER — Telehealth: Payer: Self-pay | Admitting: *Deleted

## 2020-07-01 NOTE — Telephone Encounter (Signed)
Discussed her feeling of being on fire- nerve pain- esp in her buttocks and low back.   1. Suggest using 3 lidoderm patches 12 hrs on;12 hrs off 2. Increase gabapentin to 800 mg 3x/day- for nerve pain- has already tried duloxetine (on 30 mg- couldn't tolerate higher dose- failed Lyrica) 3. If this isn't enough, can try Low dose Naltrexone 2-4 mg daily- compounded by [facility]- $37/month- not covered by insurance.  4. Can try to get insurance to cover Nucynta- vs Methadone low dose.   5- Other traditional opiates don't help nerve pain and she has textbook nerve pain- things feel on fire/matches/burning.   6. Will let me know if needs increase in Gabapentin dosing. For new Rx

## 2020-07-01 NOTE — Telephone Encounter (Signed)
Angela Gilmore has called back about the email she sent on Friday and has not had a response from Dr Berline Chough. She would like a call back from Dr Berline Chough or a rewpons through her mychart. [phone]

## 2020-07-15 DIAGNOSIS — S24103A Unspecified injury at T7-T10 level of thoracic spinal cord, initial encounter: Secondary | ICD-10-CM | POA: Insufficient documentation

## 2020-07-15 DIAGNOSIS — S24103S Unspecified injury at T7-T10 level of thoracic spinal cord, sequela: Secondary | ICD-10-CM | POA: Insufficient documentation

## 2020-07-31 ENCOUNTER — Ambulatory Visit: Payer: BC Managed Care – PPO | Admitting: Physical Medicine and Rehabilitation

## 2020-08-03 ENCOUNTER — Ambulatory Visit: Payer: BC Managed Care – PPO | Admitting: Physical Medicine and Rehabilitation

## 2020-08-10 ENCOUNTER — Encounter: Payer: Self-pay | Admitting: Physical Medicine and Rehabilitation

## 2020-08-10 ENCOUNTER — Other Ambulatory Visit: Payer: Self-pay

## 2020-08-10 ENCOUNTER — Encounter
Payer: BC Managed Care – PPO | Attending: Physical Medicine and Rehabilitation | Admitting: Physical Medicine and Rehabilitation

## 2020-08-10 VITALS — BP 144/88 | HR 71 | Temp 98.1°F | Ht 66.0 in | Wt 191.0 lb

## 2020-08-10 DIAGNOSIS — G8921 Chronic pain due to trauma: Secondary | ICD-10-CM | POA: Insufficient documentation

## 2020-08-10 DIAGNOSIS — M7918 Myalgia, other site: Secondary | ICD-10-CM | POA: Diagnosis present

## 2020-08-10 DIAGNOSIS — Z9359 Other cystostomy status: Secondary | ICD-10-CM | POA: Diagnosis present

## 2020-08-10 DIAGNOSIS — Z5181 Encounter for therapeutic drug level monitoring: Secondary | ICD-10-CM | POA: Diagnosis present

## 2020-08-10 DIAGNOSIS — K592 Neurogenic bowel, not elsewhere classified: Secondary | ICD-10-CM | POA: Diagnosis present

## 2020-08-10 DIAGNOSIS — G950 Syringomyelia and syringobulbia: Secondary | ICD-10-CM | POA: Diagnosis present

## 2020-08-10 DIAGNOSIS — L679 Hair color and hair shaft abnormality, unspecified: Secondary | ICD-10-CM | POA: Insufficient documentation

## 2020-08-10 DIAGNOSIS — G8220 Paraplegia, unspecified: Secondary | ICD-10-CM | POA: Insufficient documentation

## 2020-08-10 DIAGNOSIS — N319 Neuromuscular dysfunction of bladder, unspecified: Secondary | ICD-10-CM | POA: Insufficient documentation

## 2020-08-10 DIAGNOSIS — Z79891 Long term (current) use of opiate analgesic: Secondary | ICD-10-CM | POA: Diagnosis present

## 2020-08-10 DIAGNOSIS — R252 Cramp and spasm: Secondary | ICD-10-CM | POA: Diagnosis not present

## 2020-08-10 NOTE — Patient Instructions (Addendum)
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  1. [facility]armacy-low dose naltrexone- [phone] 2 mg daily  X 1 week, then 4 mg daily- max dose is 6mg .    2. Will try Nucynta vs methadone - after 2 weeks if no improvement    3. Spent time explaining numbness is  The expected sensation of SCI patients-  And the pain is the issue, not the numbness.   4. Spent time discussing options for nerve pain- decided on LDN for now.   5. I don't think pain is coming from below SCI- I think it's from the actual nerves. I think it's from the lack of innervation.   6. F/U in 1 month

## 2020-08-10 NOTE — Progress Notes (Signed)
Subjective:    Patient ID: Angela Gilmore, adult    DOB: [birth year], 54 y.o.   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  Still having center of back and R shoulder blade pain- and into R upper trap pain.    Still has crazy numbness- in butt or in legs- turns into bad burning, stinging-  Last month stayed in bed more than sits up- can't stand it-  Takes 2 days to get back up-   Doesn't feel numb (didn't)- prior- had no feeling, but didn't feel numb.    last 6 weeks have been terrible- per husband.   Tried lidoderm patches- didn't help at all- no help at all.   Can't have normal life- wants to  clean, get out of bed.   Pain Inventory Average Pain 5 Pain Right Now 5 My pain is constant, burning, dull, aching and numbness  In the last 24 hours, has pain interfered with the following? General activity 7 Relation with others 6 Enjoyment of life 6 What TIME of day is your pain at its worst? morning , daytime, evening and night Sleep (in general) Fair  Pain is worse with: sitting, inactivity and some activites Pain improves with: medication Relief from Meds: 1  Family History  Problem Relation Age of Onset  . Heart disease Father   . Emphysema Father   . Breast cancer Sister   . Mental illness Sister   . Stroke Brother   . Mental illness Brother    Social History   Socioeconomic History  . Marital status: Unknown    Spouse name: Not on file  . Number of children: Not on file  . Years of education: Not on file  . Highest education level: Not on file  Occupational History  . Not on file  Tobacco Use  . Smoking status: Former Games developer  . Smokeless tobacco: Never Used  Substance and Sexual Activity  . Alcohol use: Not Currently  . Drug use: Never  . Sexual activity: Not on file    Other Topics Concern  . Not on file  Social History Narrative  . Not on file   Social Determinants of Health   Financial Resource Strain:   . Difficulty of Paying Living Expenses: Not on file  Food Insecurity:   . Worried About Programme researcher, broadcasting/film/video in the Last Year: Not on file  . Ran Out of Food in the Last Year: Not on file  Transportation Needs:   . Lack of Transportation (Medical): Not on file  . Lack of Transportation (Non-Medical): Not on file  Physical Activity:   . Days of Exercise per Week: Not on file  . Minutes of Exercise per Session: Not on file  Stress:   . Feeling of Stress : Not on file  Social Connections:   . Frequency of Communication with Friends and Family: Not on file  . Frequency of Social Gatherings with Friends and Family: Not on file  . Attends Religious Services: Not on file  . Active Member of Clubs or Organizations: Not on file  . Attends Banker Meetings: Not on file  . Marital Status: Not on file   Past Surgical History:  Procedure Laterality Date  . ABDOMINAL SURGERY    . BREAST LUMPECTOMY    .  CARPAL TUNNEL RELEASE    . HIP ARTHROSCOPY W/ LABRAL REPAIR    . KNEE ARTHROPLASTY    . PARTIAL HYSTERECTOMY    . ROTATOR CUFF REPAIR    . SPINAL FIXATION SURGERY    . THORACOTOMY    . TONSILECTOMY/ADENOIDECTOMY WITH MYRINGOTOMY    . TRIGGER FINGER RELEASE    . WISDOM TOOTH EXTRACTION     Past Surgical History:  Procedure Laterality Date  . ABDOMINAL SURGERY    . BREAST LUMPECTOMY    . CARPAL TUNNEL RELEASE    . HIP ARTHROSCOPY W/ LABRAL REPAIR    . KNEE ARTHROPLASTY    . PARTIAL HYSTERECTOMY    . ROTATOR CUFF REPAIR    . SPINAL FIXATION SURGERY    . THORACOTOMY    . TONSILECTOMY/ADENOIDECTOMY WITH MYRINGOTOMY    . TRIGGER FINGER RELEASE    . WISDOM TOOTH EXTRACTION     Past Medical History:  Diagnosis Date  . Hypoglycemia   . Paraplegia (HCC)    BP (!) 144/88   Pulse 71   Temp 98.1 F (36.7 C)   Ht 5\' 6"  (1.676 m)    Wt 191 lb (86.6 kg)   SpO2 98%   BMI 30.83 kg/m   Opioid Risk Score:   Fall Risk Score:  `1  Depression screen PHQ 2/9  Depression screen PHQ 2/9 05/01/2020  Decreased Interest 0  Down, Depressed, Hopeless 0  PHQ - 2 Score 0    Review of Systems  Constitutional: Negative.   HENT: Negative.   Eyes: Negative.   Respiratory: Negative.   Cardiovascular: Negative.   Gastrointestinal: Negative.   Genitourinary: Positive for difficulty urinating.  Musculoskeletal: Positive for arthralgias, back pain, gait problem and neck pain.  Skin: Negative.   Allergic/Immunologic: Negative.   Neurological: Positive for numbness.  Hematological: Negative.   Psychiatric/Behavioral: Negative.   All other systems reviewed and are negative.      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  1. [facility]-low dose naltrexone- [phone] 2 mg daily  X 1 week, then 4 mg daily- max dose is 6mg .    2. Will try Nucynta vs methadone - after 2 weeks if no improvement    3. Spent time explaining numbness is  The expected sensation of SCI patients-  And the pain is the issue, not the numbness.   4. Spent time discussing options for nerve pain- decided on LDN for now.   5. I don't think pain is coming from below SCI- I think it's from the actual nerves. I think it's from the lack of innervation.   6. F/U in 1 month   I spent a total of 35 minutes on visit- as detailed above.

## 2020-09-21 ENCOUNTER — Encounter
Payer: BC Managed Care – PPO | Attending: Physical Medicine and Rehabilitation | Admitting: Physical Medicine and Rehabilitation

## 2020-09-21 ENCOUNTER — Other Ambulatory Visit: Payer: Self-pay

## 2020-09-21 ENCOUNTER — Encounter: Payer: Self-pay | Admitting: Physical Medicine and Rehabilitation

## 2020-09-21 VITALS — BP 147/92 | HR 78 | Temp 98.1°F | Ht 66.0 in | Wt 190.4 lb

## 2020-09-21 DIAGNOSIS — R252 Cramp and spasm: Secondary | ICD-10-CM | POA: Diagnosis not present

## 2020-09-21 DIAGNOSIS — G8921 Chronic pain due to trauma: Secondary | ICD-10-CM | POA: Diagnosis not present

## 2020-09-21 DIAGNOSIS — M7918 Myalgia, other site: Secondary | ICD-10-CM | POA: Diagnosis not present

## 2020-09-21 DIAGNOSIS — G8220 Paraplegia, unspecified: Secondary | ICD-10-CM | POA: Diagnosis not present

## 2020-09-21 NOTE — Progress Notes (Signed)
Subjective:    Patient ID: Mckinley Jewel Dapper, adult    DOB: May 09, 1966, 54 y.o.   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  Took 2 mg- still had nerve pain- but wasn't unbearable. 4 mg was helpful - even more helpful.   2 weeks, nerve pain was great. Prior to going into hospital.     Ever since went into hospital- nerve pain much more.     Having sharp pain at top of back now   Both had COVID- and was in hospital for 8 days. Went into hospital 10/4-  Was there for 8+ days.  They refused to  let her take it.  Was NOT put on the vent.   Since been home, pain has been worse.  Has been taking 4 mg daily Low dose naltrexone.   Has a 90 day supply. Was 85$ with 4$ mail in.    Bowel/bladder is OK.       Pain Inventory Average Pain 3 Pain Right Now 4 My pain is sharp, burning, stabbing and tingling  In the last 24 hours, has pain interfered with the following? General activity 2 Relation with others 2 Enjoyment of life 1 What TIME of day is your pain at its worst? morning , daytime, evening and night Sleep (in general) Fair  Pain is worse with: unsure Pain improves with: nothing Relief from Meds: 0  Family History  Problem Relation Age of Onset  . Heart disease Father   . Emphysema Father   . Breast cancer Sister   . Mental illness Sister   . Stroke Brother   . Mental illness Brother    Social History   Socioeconomic History  . Marital status: Unknown    Spouse name: Not on file  . Number of children: Not on file  . Years of education: Not on file  . Highest education level: Not on file  Occupational History  . Not on file  Tobacco Use  . Smoking status: Former Games developer  . Smokeless tobacco: Never Used  Substance and Sexual Activity  . Alcohol use: Not Currently  .  Drug use: Never  . Sexual activity: Not on file  Other Topics Concern  . Not on file  Social History Narrative  . Not on file   Social Determinants of Health   Financial Resource Strain:   . Difficulty of Paying Living Expenses: Not on file  Food Insecurity:   . Worried About Programme researcher, broadcasting/film/video in the Last Year: Not on file  . Ran Out of Food in the Last Year: Not on file  Transportation Needs:   . Lack of Transportation (Medical): Not on file  . Lack of Transportation (Non-Medical): Not on file  Physical Activity:   . Days of Exercise per Week: Not on file  . Minutes of Exercise per Session: Not on file  Stress:   . Feeling of Stress : Not on file  Social Connections:   . Frequency of Communication with Friends and Family: Not on file  . Frequency of Social Gatherings with Friends and Family: Not on file  . Attends Religious Services: Not on file  . Active Member of Clubs or Organizations: Not on file  . Attends Banker Meetings: Not on file  . Marital Status: Not  on file   Past Surgical History:  Procedure Laterality Date  . ABDOMINAL SURGERY    . BREAST LUMPECTOMY    . CARPAL TUNNEL RELEASE    . HIP ARTHROSCOPY W/ LABRAL REPAIR    . KNEE ARTHROPLASTY    . PARTIAL HYSTERECTOMY    . ROTATOR CUFF REPAIR    . SPINAL FIXATION SURGERY    . THORACOTOMY    . TONSILECTOMY/ADENOIDECTOMY WITH MYRINGOTOMY    . TRIGGER FINGER RELEASE    . WISDOM TOOTH EXTRACTION     Past Surgical History:  Procedure Laterality Date  . ABDOMINAL SURGERY    . BREAST LUMPECTOMY    . CARPAL TUNNEL RELEASE    . HIP ARTHROSCOPY W/ LABRAL REPAIR    . KNEE ARTHROPLASTY    . PARTIAL HYSTERECTOMY    . ROTATOR CUFF REPAIR    . SPINAL FIXATION SURGERY    . THORACOTOMY    . TONSILECTOMY/ADENOIDECTOMY WITH MYRINGOTOMY    . TRIGGER FINGER RELEASE    . WISDOM TOOTH EXTRACTION     Past Medical History:  Diagnosis Date  . Hypoglycemia   . Paraplegia (HCC)    BP (!) 147/92   Pulse  78   Temp 98.1 F (36.7 C)   Ht 5\' 6"  (1.676 m)   Wt 190 lb 6.4 oz (86.4 kg)   SpO2 99%   BMI 30.73 kg/m   Opioid Risk Score:   Fall Risk Score:  `1  Depression screen PHQ 2/9  Depression screen PHQ 2/9 05/01/2020  Decreased Interest 0  Down, Depressed, Hopeless 0  PHQ - 2 Score 0    Review of Systems An entire ROS was completed and found to be negative, except HPI.     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  1. Will give Low dose Naltrexone another 7-10 days AT MOST. And call ME to discuss other options-     A. Like to  increase LDN (low dose naltrexone to 6-8 mg daily)  B. Methadone or Nucynta  - spent time discussing methadone and low dose vs high dose and how it's a medicine that can't get you high and can't be crushed to get a high.   2. Has gotten a 3 months supply of 4 mg LDN- SO, if increases, will need to take 4 mg PLUS additional dose.   3. Likely has increase spasms due to COVID vs increase Baclofen.  - wants to see- call me if needs more meds for spasticity.   4. F/U in 6 weeks.    I spent a total of 45 minutes on this appointment- as detailed above

## 2020-09-21 NOTE — Patient Instructions (Signed)
1. Will give Low dose Naltrexone another 7-10 days AT MOST. And call ME to discuss other options-     A. Like to  increase LDN (low dose naltrexone to 6-8 mg daily)  B. Methadone or Nyucynta  - spent time discussing methadone and low dose vs high dose and how it's a medicine that can't get you high and can't be crushed to get a high.   2. Has gotten a 3 months supply of 4 mg LDN- SO, if increases, will need to take 4 mg PLUS additional dose.   3. Likely has increase spasms due to COVID vs increase Baclofen.  - wants to see- call me if needs more meds for spasticity.   4. F/U in 6 weeks.

## 2020-09-28 ENCOUNTER — Telehealth: Payer: Self-pay | Admitting: *Deleted

## 2020-09-28 NOTE — Telephone Encounter (Signed)
Patient called and left a message to report back how she is doing as requested by Dr. Berline Chough. She reports she is not feeling better. Still having extreme nerve pain. Very uncomfortable. Asking for Dr. Berline Chough to pleas call.

## 2020-09-28 NOTE — Telephone Encounter (Signed)
  Increase Low dose naltrexone- to 8 mg daily-   And call me Friday am and see how it works- if it's better, will stay the course- if not, will change to Methadone vs nucynta- I think we will have more chance of insurance covering Methadone first before Nucynta. But can try either. Depending on pt request. .

## 2020-10-02 ENCOUNTER — Telehealth: Payer: Self-pay

## 2020-10-02 MED ORDER — METHADONE HCL 5 MG PO TABS: 5.0000 mg | ORAL_TABLET | Freq: Two times a day (BID) | ORAL | 0 refills | Status: DC

## 2020-10-02 NOTE — Telephone Encounter (Signed)
Patient called stating she was told to call to report if Naltrexone 8 mg was working for her and she states it didn't help.

## 2020-10-02 NOTE — Telephone Encounter (Signed)
   Low dose naltrexone 8 mg hasn't helped- not helping at all-  Back killing her, butt killing her- down back to butt and into legs/feet.   All nerve pain.   Methadone- will try 5 mg 2x/day- suggest 2.5 mg 2x/day since hasn't been on a opiate this strong,  For a few days- then go to 5 mg BID/2x/day.    Pt has already failed almost every nerve pain medicine there is- Duloxetine, Lyrica, Gabapentin, Trileptal, Keppra, as well as Low dose naltrexone-  There isn't any other nerve pain meds left to try except Nucynta, which will be tried if Methadone fails.  Have sent into her pharmacy-  Call me back in 1 week, since sent in 1 week supply, to get refilled.

## 2020-10-08 ENCOUNTER — Telehealth: Payer: Self-pay

## 2020-10-08 MED ORDER — METHADONE HCL 5 MG PO TABS: 5.0000 mg | ORAL_TABLET | Freq: Two times a day (BID) | ORAL | 0 refills | Status: DC

## 2020-10-08 NOTE — Telephone Encounter (Signed)
Mrs. Boxx called for refill of Methadone 5 mg. It helped some and she was able to get out of the bed.   She will be okay to continue the same dose. Until she comes back to see you. Then consider changing or increasing the pain medication.  Call back ph [phone]. Thank you

## 2020-10-08 NOTE — Telephone Encounter (Signed)
Sent in methadone for pt- 5 mg BID- is actually helping nerve pain for the first time, that's she's had relief from nerve pain meds lately.   Will con't methadone for her- sent in 1 month Rx.  Let pt know.

## 2020-10-26 ENCOUNTER — Telehealth: Payer: Self-pay | Admitting: *Deleted

## 2020-10-26 MED ORDER — BUPRENORPHINE 10 MCG/HR TD PTWK: 1.0000 | MEDICATED_PATCH | TRANSDERMAL | 0 refills | Status: DC

## 2020-10-26 NOTE — Telephone Encounter (Signed)
Angela Gilmore called and is having so much pain. She has been in bed for days. The pain and numbness is terrible. Please call her. [phone]

## 2020-10-26 NOTE — Telephone Encounter (Signed)
  Can have 1 good days, and then in bed for 7 days after that.   Body goes numb and then pain is so bad, cannot sit in chair.   Even if laying in bed, pain not getting better.   Doesn't feel like  Methadone is helping much- not getting better at all.  Having vehicle issues- can't get out.     Plan: 1.Now has failed Methadone, Low dose Naltrexone, Lyrica, Already on Gabapentin and Duloxetine; and also failed Trileptal and Keppra for nerve pain.  Really not much else to try- she's a Paraplegic with horrific nerve pain that makes it impossible to even be up in the w/c right now at all.   2. Will try Butrans patch 10 mg/hr qweek- 7 days prescription for now- once gets Butrans patch- wait 2 days on methadone, then stop Methadone.    3. Increase Duloxetine to 60 mg daily. Has enough for now   4. Will get prior authorization for Butrans patch asap.   5. has f/u on 11/02/20. Keep it!

## 2020-10-29 ENCOUNTER — Telehealth: Payer: Self-pay

## 2020-10-29 NOTE — Telephone Encounter (Signed)
Prescription was sent for Buprenorphine 10 mch patch qty 1. Pharmacy sending a message stating "Alternative requested: comes in box of 4-we can not break the box-can you resend for 4?" You was meaning qty 1 has in one box or 1 patch?

## 2020-11-02 ENCOUNTER — Encounter
Payer: BC Managed Care – PPO | Attending: Physical Medicine and Rehabilitation | Admitting: Physical Medicine and Rehabilitation

## 2020-11-02 ENCOUNTER — Encounter: Payer: Self-pay | Admitting: Physical Medicine and Rehabilitation

## 2020-11-02 ENCOUNTER — Other Ambulatory Visit: Payer: Self-pay

## 2020-11-02 VITALS — BP 125/78 | HR 100 | Temp 98.1°F

## 2020-11-02 DIAGNOSIS — G8220 Paraplegia, unspecified: Secondary | ICD-10-CM | POA: Diagnosis not present

## 2020-11-02 DIAGNOSIS — R252 Cramp and spasm: Secondary | ICD-10-CM | POA: Diagnosis not present

## 2020-11-02 DIAGNOSIS — M792 Neuralgia and neuritis, unspecified: Secondary | ICD-10-CM

## 2020-11-02 DIAGNOSIS — L679 Hair color and hair shaft abnormality, unspecified: Secondary | ICD-10-CM | POA: Diagnosis not present

## 2020-11-02 DIAGNOSIS — G8921 Chronic pain due to trauma: Secondary | ICD-10-CM

## 2020-11-02 MED ORDER — DULOXETINE HCL 60 MG PO CPEP: 60.0000 mg | ORAL_CAPSULE | Freq: Every day | ORAL | 3 refills | Status: DC

## 2020-11-02 MED ORDER — BUPRENORPHINE 10 MCG/HR TD PTWK: 1.0000 | MEDICATED_PATCH | TRANSDERMAL | 0 refills | Status: DC

## 2020-11-02 NOTE — Patient Instructions (Signed)
  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   1. Rewrote Butrans patch 10 mcg q7 days. #4 for 1 month supply.  Cover it with tegaderm  Patch is ~ 75x60 mm  2.Paraplegia-  [facility] potluck in [facility] Tuesday Dec 21- 12-1 pm- [employer]. [place], [address] [address] [phone] For support group  3. Nerve pain- Cymbalta 60 mg daily- 3 months supply-  With 3 refills.    4. Nerve pain- Stay on Methadone until 2 days AFTER start Butrans patch- then can stop- OR 2.5 mg 2x/day x 1 week, THEN stop.   5. LE edema due to paraplegia- Rx written for B/L LE TEDS 20-30 mmHg  5 pairs  6. LE edema-  Can also TRY LE compression socks- can get with cute patterns- 15-20 mmHg.   7. Hair loss- Suggest having PCP look into mineral deficit- most common mineral issue is Copper. But Copper, thyroid, female hormones, etc.   8.  F/U in 2 months-double appointment-  SCI

## 2020-11-02 NOTE — Progress Notes (Signed)
Subjective:    Patient ID: Angela Gilmore, adult    DOB: 1966/05/26, 54 y.o.   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  Today hurts more in legs-  Different places, different days.   No real change in pain- didn't increase in Methadone, since was supposed to get Butrans patch.   Some days lays in bed, and does nothing by cry.  She's accepted she's paralyzed, but hurts SO bad.   Not on Tramadol anymore.   Wears compression stockings- wearing due to  swelling-   Hair falling out by handful by now.    Pain Inventory Average Pain 4 Pain Right Now 4 My pain is sharp, burning, stabbing, tingling and aching  In the last 24 hours, has pain interfered with the following? General activity 4 Relation with others 2 Enjoyment of life 7 What TIME of day is your pain at its worst? morning , daytime, evening and night Sleep (in general) Fair  Pain is worse with: non Pain improves with: none Relief from Meds: na  Family History  Problem Relation Age of Onset  . Heart disease Father   . Emphysema Father   . Breast cancer Sister   . Mental illness Sister   . Stroke Brother   . Mental illness Brother    Social History   Socioeconomic History  . Marital status: Unknown    Spouse name: Not on file  . Number of children: Not on file  . Years of education: Not on file  . Highest education level: Not on file  Occupational History  . Not on file  Tobacco Use  . Smoking status: Former Games developer  . Smokeless tobacco: Never Used  Substance and Sexual Activity  . Alcohol use: Not Currently  . Drug use: Never  . Sexual activity: Not on file  Other Topics Concern  . Not on file  Social History Narrative  . Not on file   Social Determinants of Health   Financial Resource Strain: Not on file   Food Insecurity: Not on file  Transportation Needs: Not on file  Physical Activity: Not on file  Stress: Not on file  Social Connections: Not on file   Past Surgical History:  Procedure Laterality Date  . ABDOMINAL SURGERY    . BREAST LUMPECTOMY    . CARPAL TUNNEL RELEASE    . HIP ARTHROSCOPY W/ LABRAL REPAIR    . KNEE ARTHROPLASTY    . PARTIAL HYSTERECTOMY    . ROTATOR CUFF REPAIR    . SPINAL FIXATION SURGERY    . THORACOTOMY    . TONSILECTOMY/ADENOIDECTOMY WITH MYRINGOTOMY    . TRIGGER FINGER RELEASE    . WISDOM TOOTH EXTRACTION     Past Surgical History:  Procedure Laterality Date  . ABDOMINAL SURGERY    . BREAST LUMPECTOMY    . CARPAL TUNNEL RELEASE    . HIP ARTHROSCOPY W/ LABRAL REPAIR    . KNEE ARTHROPLASTY    . PARTIAL HYSTERECTOMY    . ROTATOR CUFF REPAIR    . SPINAL FIXATION SURGERY    . THORACOTOMY    . TONSILECTOMY/ADENOIDECTOMY WITH MYRINGOTOMY    . TRIGGER FINGER RELEASE    . WISDOM TOOTH EXTRACTION     Past Medical History:  Diagnosis Date  . Hypoglycemia   .  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   1. Rewrote Butrans patch 10 mcg q7 days. #4 for 1 month supply.  Cover it with tegaderm  Patch is ~ 75x60 mm  2.Paraplegia-  [facility] potluck in [facility] Tuesday Dec 21- 12-1 pm- [employer]. [place],  [id number] [address] [phone] For support group  3. Nerve pain- Cymbalta 60 mg daily- 3 months supply-  With 3 refills.    4. Nerve pain- Stay on Methadone until 2 days AFTER start Butrans patch- then can stop- OR 2.5 mg 2x/day x 1 week, THEN stop.   5. LE edema due to paraplegia- Rx written for B/L LE TEDS 20-30 mmHg  5 pairs  6. LE edema-  Can also TRY LE compression socks- can get with cute patterns- 15-20 mmHg.   7. Hair loss- Suggest having PCP look into mineral deficit- most common mineral issue is Copper. But Copper, thyroid, female hormones, etc.   8.  F/U in 2 months-double appointment-  [facility]I   I spent a total of 30 minutes on visit- as detailed above.

## 2020-11-03 NOTE — Telephone Encounter (Signed)
Angela Gilmore called to report a problem with the pharmacy and the patch.  [facility] and [employer] will not cover Butrans patch, it must be ordered as Belbuca. Pleas advise.  Angela Gilmore had asked that you call her as she has limited pills left. # [id number].

## 2020-11-04 ENCOUNTER — Telehealth: Payer: Self-pay | Admitting: *Deleted

## 2020-11-04 MED ORDER — BELBUCA 150 MCG BU FILM: 150.0000 ug | ORAL_FILM | Freq: Two times a day (BID) | BUCCAL | 0 refills | Status: DC

## 2020-11-04 NOTE — Telephone Encounter (Signed)
Angela Gilmore needs a prior [place] on Barron.  If it is not approved by Friday, she will be out of methadone and will need a new rx for that medication.

## 2020-11-04 NOTE — Telephone Encounter (Signed)
Changed Butrans patch to Belbuca film 150 mcg BID- can continue to increase, but will start with 150 mcg due to severity of pain and no relief with other meds.  Also wrote either 7days supply OR 1 month if box cannot be broken

## 2020-11-04 NOTE — Telephone Encounter (Signed)
Notified Mrs Nicks.

## 2020-11-05 MED ORDER — METHADONE HCL 5 MG PO TABS: 5.0000 mg | ORAL_TABLET | Freq: Two times a day (BID) | ORAL | 0 refills | Status: DC

## 2020-11-05 NOTE — Telephone Encounter (Signed)
I have let Meloni know.

## 2020-11-05 NOTE — Telephone Encounter (Signed)
Renewed it for 1 month so that should give [place] time, esp if it doesn't work.   Thanks, Let her know, please, ML

## 2020-11-05 NOTE — Telephone Encounter (Signed)
Rocky Link is still working diligently on getting the prior auth for [employer], but he is having multiple road blocks.  Angela Gilmore only has enough Methadone through Saturday and she is worried that she may need to get a refill on the methadone until it comes through.

## 2020-11-06 NOTE — Telephone Encounter (Signed)
Prior Auth submitted and approved by [employer] [place] dates 11/05/20 - 05/06/2021. Angela Gilmore notified and faxed approval to pharmacy.

## 2020-12-04 ENCOUNTER — Telehealth: Payer: Self-pay | Admitting: *Deleted

## 2020-12-04 NOTE — Telephone Encounter (Signed)
Angela Gilmore called and said that her belbuca is not helping her nerve pain.  She is asking if she should continue using it. I have called her back and left a message that Dr Berline Chough is out of the office and will return Monday to the hospital. It is uncertain with weather if our office will be open. I advised to stay with what she is doing until Dr Berline Chough can get back to her to advise.

## 2020-12-08 MED ORDER — BUPRENORPHINE 10 MCG/HR TD PTWK: 1.0000 | MEDICATED_PATCH | TRANSDERMAL | 0 refills | Status: DC

## 2020-12-08 NOTE — Telephone Encounter (Signed)
Angela Gilmore has now called back twice about her belbuca and nerve pain since her call on 12/04/20.  She is needing a refill today until Dr Berline Chough lets her know what can be done (se earlier message on 1/14).  She is out of her belbuca.

## 2020-12-08 NOTE — Telephone Encounter (Signed)
No relief at all from belbuca.   Has been a lot worse since off the methadone- which maybe helped 10%- but this one has helped 0% NO relief whatsoever-   Insurance required this before butrans patch. Will try to get Butran's patch covered, which required trying belbuca prior.   Take 1 dose of methadone 2x/day until gets Butran's patch   She has methadone at home, but is out of belbuca.  Also, spoke with our office pre authorization gentleman- he will work on it.

## 2020-12-09 NOTE — Telephone Encounter (Signed)
Prior auth submitted via covermymeds:

## 2020-12-11 ENCOUNTER — Telehealth: Payer: Self-pay | Admitting: *Deleted

## 2020-12-11 NOTE — Telephone Encounter (Signed)
Can go to Methadone 10 mg 2x/day for now- if has enough, great, if doesn't, then can call more in for her.  Let me know- ML

## 2020-12-11 NOTE — Telephone Encounter (Signed)
Angela Gilmore has called back and is tearful and in a lot of pain.  She is uncertain what she should do while waiting for approval for her butrans patch. She is taking the 5 mg methadone but says Dr Berline Chough said she can increase it but she doesn't remember to what dose.  Please advise. ( no work yet from [employer] about butrans)

## 2020-12-11 NOTE — Telephone Encounter (Signed)
I let her know.  She will call back before clinic closes if she will be needing more methadone.  Her husband is not home and she cannot get to it to count pills until he returns.

## 2020-12-17 ENCOUNTER — Telehealth: Payer: Self-pay

## 2020-12-17 MED ORDER — METHADONE HCL 10 MG PO TABS: 10.0000 mg | ORAL_TABLET | Freq: Two times a day (BID) | ORAL | 0 refills | Status: DC

## 2020-12-17 NOTE — Telephone Encounter (Signed)
Patient called and states still waiting for insurance to approve - she needs to know if she is to continue methadone - then she needs script - no one has called to confirm Angela Gilmore is approved or not-- please contact patient and advise if she is getting a refill or other is approved -- she states 2nd day of not knowing

## 2020-12-17 NOTE — Telephone Encounter (Signed)
Fnish 2-3 more days of 10 mg BID of methadone , then go to 15 mg 2x/day x at least 5 days, then go to 20 mg 2x/day- ofr nerve pain.   Waiting to hear about Butrans patch approval/doing peer to peer today- have an appointment 3-4 pm.

## 2020-12-23 ENCOUNTER — Telehealth: Payer: Self-pay | Admitting: *Deleted

## 2020-12-23 NOTE — Telephone Encounter (Signed)
Clois Comber approved following a peer to peer review by Dr. Dagoberto Ligas.  However, I contacted the pharmacy and despite the prior approval the medication is going to cost $125.00 per month until patients deductible is met.  Patient admits that is too much. She says she I Has seen reasonable improvement with methadone.  I consulted Dr. Dagoberto Ligas, she says to keep with plan with the methadone.  I contacted the patient and informed

## 2021-01-04 ENCOUNTER — Other Ambulatory Visit: Payer: Self-pay

## 2021-01-04 ENCOUNTER — Telehealth: Payer: Self-pay | Admitting: *Deleted

## 2021-01-04 ENCOUNTER — Encounter
Payer: BC Managed Care – PPO | Attending: Physical Medicine and Rehabilitation | Admitting: Physical Medicine and Rehabilitation

## 2021-01-04 ENCOUNTER — Encounter: Payer: Self-pay | Admitting: Physical Medicine and Rehabilitation

## 2021-01-04 VITALS — BP 109/69 | HR 88 | Temp 98.2°F | Ht 66.0 in

## 2021-01-04 DIAGNOSIS — N319 Neuromuscular dysfunction of bladder, unspecified: Secondary | ICD-10-CM

## 2021-01-04 DIAGNOSIS — G8220 Paraplegia, unspecified: Secondary | ICD-10-CM

## 2021-01-04 DIAGNOSIS — G904 Autonomic dysreflexia: Secondary | ICD-10-CM | POA: Diagnosis present

## 2021-01-04 DIAGNOSIS — Z9359 Other cystostomy status: Secondary | ICD-10-CM

## 2021-01-04 DIAGNOSIS — G8921 Chronic pain due to trauma: Secondary | ICD-10-CM | POA: Diagnosis not present

## 2021-01-04 MED ORDER — METHADONE HCL 10 MG PO TABS: 20.0000 mg | ORAL_TABLET | Freq: Two times a day (BID) | ORAL | 0 refills | Status: DC

## 2021-01-04 MED ORDER — OXYCODONE HCL 5 MG PO TABS: 5.0000 mg | ORAL_TABLET | Freq: Four times a day (QID) | ORAL | 0 refills | Status: DC | PRN

## 2021-01-04 NOTE — Progress Notes (Signed)
Subjective:    Patient ID: Angela Gilmore, adult    DOB: 10/05/1966, 55 y.o.   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   Still struggling Methadone 15 mg BID- didn't up to 20 mg BID because thought UTI  Hasn't nasty UTI- could have contributing to pain issues- day 6 of PO ABX- UTI Sx's are better.   Has 2 good days this week, every other day was terrible.  Got OOB on 2 GOOD days this week; 5 days bad days.   Struggling with numbness and pain even in bed now.  Butt hurts when numbness wears off-  And that's the pain we are treating- nothing environmental is helping pain.   When goes to bed, feels like spinal cord "gets smaller/crushed" and things could make her numb, then hurt.   More luck on Methadone than any other meds.  Belbuca. Never tried Butrans- patch-  since so costly and Belbuca didn't work.   Having issues with heart rate- plummets to 60s- sitting and then will get to 160s.  Wore a heart monitor and was OK-  And "ran tests" that she doesn't know type- but they were "ok".    Spasms are under pretty good control- not kicking much anymore.   The longer she's paralyzed, the more pain issues having.   Depressed about SCI due to PAIN issues- cannot function when in so much pain.   Sweating above level of lesion- soaking wet when has numbness episodes.  Cannot use leg loops or dog collars to move legs well- needs husband to do so.   Pain Inventory Average Pain 10 Pain Right Now 4 My pain is intermittent, sharp, burning, dull, stabbing, tingling, aching and nunbness   LOCATION OF PAIN  Buttocks, back, legs BOWEL Number of stools per week: 3-4 Oral laxative use Yes  Type of laxative Miralax Enema or suppository use Yes  History of colostomy No  Incontinent No    BLADDER Suprapubic In and out cath, frequency N/A Able to self cath No  Bladder incontinence No  Frequent urination No  Leakage with coughing No  Difficulty starting stream No  Incomplete bladder emptying No    Mobility use a wheelchair needs help with transfers  Function disabled: date disabled 03/02/2019  Neuro/Psych numbness tingling spasms dizziness depression anxiety  Prior Studies Any changes since last visit?  yes Xray at [facility] - Chest Xray  Physicians involved in your care Any changes since last visit?  no   Family History  Problem Relation Age of Onset  . Heart disease Father   . Emphysema Father   . Breast cancer Sister   . Mental illness Sister   . Stroke Brother   . Mental illness Brother    Social History   Socioeconomic History  . Marital status: Unknown    Spouse name: Not on file  . Number of children: Not on file  . Years of education: Not on file  . Highest education level: Not on file  Occupational History  . Not on file  Tobacco Use  . Smoking status: Former Games developer  . Smokeless tobacco: Never Used  Vaping Use  . Vaping Use: Never used  Substance and Sexual Activity  . Alcohol use: Not Currently  . Drug use: Never  . Sexual activity:  Not on file  Other Topics Concern  . Not on file  Social History Narrative  . Not on file   Social Determinants of Health   Financial Resource Strain: Not on file  Food Insecurity: Not on file  Transportation Needs: Not on file  Physical Activity: Not on file  Stress: Not on file  Social Connections: Not on file   Past Surgical History:  Procedure Laterality Date  . ABDOMINAL SURGERY    . BREAST LUMPECTOMY    . CARPAL TUNNEL RELEASE    . HIP ARTHROSCOPY W/ LABRAL REPAIR    . KNEE ARTHROPLASTY    . PARTIAL HYSTERECTOMY    . ROTATOR CUFF REPAIR    . SPINAL FIXATION SURGERY    . THORACOTOMY    . TONSILECTOMY/ADENOIDECTOMY WITH MYRINGOTOMY    . TRIGGER FINGER RELEASE    .  WISDOM TOOTH EXTRACTION     Past Medical History:  Diagnosis Date  . Hypoglycemia   . Paraplegia (HCC)    BP 109/69   Pulse 88   Temp 98.2 F (36.8 C)   Ht 5\' 6"  (1.676 m)   SpO2 97%   BMI 30.73 kg/m   Opioid Risk Score:   Fall Risk Score:  `1  Depression screen PHQ 2/9  Depression screen PHQ 2/9 05/01/2020  Decreased Interest 0  Down, Depressed, Hopeless 0  PHQ - 2 Score 0   Review of Systems An entire ROS was completed and found to be negative except for HPI.      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 Biggest limiter is nerve pain.    1. Ask Cardiology- specifically about Methadone and if can increase 20 mg BID- based on QTc interval on EKG.   2. Discussed Nerve stimulator-  And how it works and what is done. Think about what want me to do for pain.   3. Discussed pain pump and ketamine infusions.  Think about what wants to do.    4. I will do research to see about ketamine infusions.   5.  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 Which is when BP is too high.  Usually comes sweating above lesion, nasal congestion, headache, sense of impending doom; have to figure out what the cause is and treat it.  Most common causes- of AD- are bowel and bladder- can have sediment clog bladder, got to flush it; and any NOXIOUS stimulation below lesion.    6.  No more than 15 reps- of weights. Or any exercise.  Increase time out of bed by no more than 2 hours daily.    7.  Oxycodone 5 mg as needed for breakthrough pain when having autonomic dysreflexia- AD. And cannot fix the issue.    8. F/U in 2 months- double visit-   I spent a total of 40  minutes on visit- as detailed above.

## 2021-01-04 NOTE — Patient Instructions (Signed)
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 Biggest limiter is nerve pain.    1. Ask Cardiology- specifically about Methadone and if can increase 20 mg BID- based on QTc interval on EKG.   2. Discussed Nerve stimulator-  And how it works and what is done. Think about what want me to do for pain.   3. Discussed pain pump and ketamine infusions.  Think about what wants to do.    4. I will do research to see about ketamine infusions.   5.  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 Which is when BP is too high.  Usually comes sweating above lesion, nasal congestion, headache, sense of impending doom; have to figure out what the cause is and treat it.  Most common causes- of AD- are bowel and bladder- can have sediment clog bladder, got to flush it; and any NOXIOUS stimulation below lesion.    6.  No more than 15 reps- of weights. Or any exercise.  Increase time out of bed by no more than 2 hours daily.    7.  Oxycodone 5 mg as needed for breakthrough pain when having autonomic dysreflexia- AD. And cannot fix the issue.    8. F/U in 2 months- double visit-

## 2021-01-04 NOTE — Telephone Encounter (Signed)
Angela Gilmore called because [facility] 12/17/20 and she is having a problem because they say they cannot fill until 01/15/21 and she will not have enough methadone.  [facility] and what happened was they only had # 60 tablets available for the Rx and she should be able to get the next refill tomorrow. I have let Adelaine know to call them tomorrow.

## 2021-01-06 ENCOUNTER — Telehealth: Payer: Self-pay | Admitting: *Deleted

## 2021-01-06 NOTE — Telephone Encounter (Signed)
Sheva called and reports that when she went to get the methadone at her pharmacy, she was told the insurance will not allow her to take 4 a day but only 3 per day, without talking to Dr Berline Chough again about the need.  She reports that she was so frustrated she just went ahead and paid for the month out of pocket. Since she has the medication it is not a rush, but when Dr Berline Chough has a chance to address this with the insurance and get it solved, she would appreciate a call notifying her.

## 2021-01-07 NOTE — Telephone Encounter (Signed)
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   If gets more swelling- to call PCP.  Wearing compression socks/stockings daily x 6 months reduces risk of Post thrombotic syndrome- that's where can get a LOT more swelling due to post thrombotic syndrome.  Once gets OK by Cardiology (if does) to increase methadone to 20 mg BID, then can have her call 4-5 days prior to when it's due to be refilled and wil get prior authorization from insurance to cover Methadone higher dose- it doesn't come in higher dose than 10 mg.

## 2021-01-19 ENCOUNTER — Other Ambulatory Visit: Payer: Self-pay | Admitting: *Deleted

## 2021-01-19 MED ORDER — BACLOFEN 20 MG PO TABS: 40.0000 mg | ORAL_TABLET | Freq: Four times a day (QID) | ORAL | 11 refills | Status: DC

## 2021-02-18 ENCOUNTER — Telehealth: Payer: Self-pay | Admitting: *Deleted

## 2021-02-18 MED ORDER — METHADONE HCL 10 MG PO TABS
20.0000 mg | ORAL_TABLET | Freq: Two times a day (BID) | ORAL | 0 refills | Status: DC
Start: 2021-02-18 — End: 2021-03-30

## 2021-02-18 MED ORDER — OXYCODONE HCL 5 MG PO TABS
5.0000 mg | ORAL_TABLET | Freq: Four times a day (QID) | ORAL | 0 refills | Status: DC | PRN
Start: 2021-02-18 — End: 2021-04-21

## 2021-02-18 NOTE — Telephone Encounter (Signed)
Angela Gilmore called and will be out of her Methadone on Sunday. She is requesting refills on this medication and her oxycodone since her appt was moved to 04/07/21. Per the PMP her last fill dates were: 01/06/2021 Methadone Hcl 10 Mg Tablet #120.00 30  01/04/2021 Oxycodone Hcl (Ir) 5 Mg Tablet #60.00

## 2021-02-18 NOTE — Telephone Encounter (Signed)
I notified her and she says they are going to have her stay at the 1.5 tablet instead of 2 tablets.

## 2021-03-03 ENCOUNTER — Ambulatory Visit: Payer: BC Managed Care – PPO | Admitting: Physical Medicine and Rehabilitation

## 2021-03-14 ENCOUNTER — Other Ambulatory Visit: Payer: Self-pay | Admitting: Physical Medicine and Rehabilitation

## 2021-03-29 ENCOUNTER — Telehealth: Payer: Self-pay | Admitting: *Deleted

## 2021-03-29 NOTE — Telephone Encounter (Signed)
Requesting her methadone refill.  Last filled by PMP 02/18/21 (oxycodone was filled same day 02/18/21 though she did not say refill oxy on message) Next appt is 04/07/21.

## 2021-03-30 MED ORDER — METHADONE HCL 10 MG PO TABS: 20.0000 mg | ORAL_TABLET | Freq: Two times a day (BID) | ORAL | 0 refills | Status: DC

## 2021-03-30 NOTE — Telephone Encounter (Signed)
Renewed methadone 10 mg tabs 1.5-2 tabs BID- last refill 3/31- next appt 5/18-

## 2021-04-07 ENCOUNTER — Encounter: Payer: BC Managed Care – PPO | Admitting: Physical Medicine and Rehabilitation

## 2021-04-21 ENCOUNTER — Other Ambulatory Visit: Payer: Self-pay

## 2021-04-21 MED ORDER — OXYCODONE HCL 5 MG PO TABS: 5.0000 mg | ORAL_TABLET | Freq: Four times a day (QID) | ORAL | 0 refills | Status: DC | PRN

## 2021-05-14 ENCOUNTER — Encounter
Payer: BC Managed Care – PPO | Attending: Physical Medicine and Rehabilitation | Admitting: Physical Medicine and Rehabilitation

## 2021-05-14 ENCOUNTER — Encounter: Payer: Self-pay | Admitting: Physical Medicine and Rehabilitation

## 2021-05-14 ENCOUNTER — Other Ambulatory Visit: Payer: Self-pay

## 2021-05-14 VITALS — BP 95/60 | HR 91 | Temp 97.8°F | Ht 66.0 in

## 2021-05-14 DIAGNOSIS — Z5181 Encounter for therapeutic drug level monitoring: Secondary | ICD-10-CM

## 2021-05-14 DIAGNOSIS — Z79891 Long term (current) use of opiate analgesic: Secondary | ICD-10-CM | POA: Insufficient documentation

## 2021-05-14 DIAGNOSIS — M792 Neuralgia and neuritis, unspecified: Secondary | ICD-10-CM | POA: Diagnosis present

## 2021-05-14 DIAGNOSIS — R252 Cramp and spasm: Secondary | ICD-10-CM | POA: Insufficient documentation

## 2021-05-14 DIAGNOSIS — Z9359 Other cystostomy status: Secondary | ICD-10-CM | POA: Diagnosis present

## 2021-05-14 DIAGNOSIS — G8220 Paraplegia, unspecified: Secondary | ICD-10-CM | POA: Insufficient documentation

## 2021-05-14 DIAGNOSIS — G8921 Chronic pain due to trauma: Secondary | ICD-10-CM | POA: Insufficient documentation

## 2021-05-14 DIAGNOSIS — U099 Post covid-19 condition, unspecified: Secondary | ICD-10-CM | POA: Insufficient documentation

## 2021-05-14 MED ORDER — OXYCODONE HCL 5 MG PO TABS: 5.0000 mg | ORAL_TABLET | Freq: Four times a day (QID) | ORAL | 0 refills | Status: DC | PRN

## 2021-05-14 MED ORDER — METHADONE HCL 10 MG PO TABS: 20.0000 mg | ORAL_TABLET | Freq: Two times a day (BID) | ORAL | 0 refills | Status: DC
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
## 2021-05-19 LAB — DRUG TOX MONITOR 1 W/CONF, ORAL FLD
Alprazolam: NEGATIVE ng/mL (ref <0.50)
Amphetamines: NEGATIVE ng/mL (ref <10)
Barbiturates: NEGATIVE ng/mL (ref <10)
Benzodiazepines: NEGATIVE ng/mL (ref <0.50)
Buprenorphine: NEGATIVE ng/mL (ref <0.10)
Chlordiazepoxide: NEGATIVE ng/mL (ref <0.50)
Clonazepam: NEGATIVE ng/mL (ref <0.50)
Cocaine: NEGATIVE ng/mL (ref <5.0)
Codeine: NEGATIVE ng/mL (ref <2.5)
Diazepam: NEGATIVE ng/mL (ref <0.50)
Dihydrocodeine: NEGATIVE ng/mL (ref <2.5)
EDDP: NEGATIVE ng/mL (ref <5.0)
Flunitrazepam: NEGATIVE ng/mL (ref <0.50)
Flurazepam: NEGATIVE ng/mL (ref <0.50)
Heroin Metabolite: NEGATIVE ng/mL (ref <1.0)
Hydrocodone: NEGATIVE ng/mL (ref <2.5)
Hydromorphone: NEGATIVE ng/mL (ref <2.5)
Lorazepam: NEGATIVE ng/mL (ref <0.50)
MARIJUANA: NEGATIVE ng/mL (ref <2.5)
MDMA: NEGATIVE ng/mL (ref <10)
Meprobamate: NEGATIVE ng/mL (ref <2.5)
Methadone: 72.4 ng/mL — ABNORMAL HIGH (ref <5.0)
Methadone: POSITIVE ng/mL — AB (ref <5.0)
Morphine: NEGATIVE ng/mL (ref <2.5)
Naloxone: NEGATIVE ng/mL (ref <0.25)
Nicotine Metabolite: NEGATIVE ng/mL (ref <5.0)
Norbuprenorphine: NEGATIVE ng/mL (ref <0.50)
Nordiazepam: NEGATIVE ng/mL (ref <0.50)
Norhydrocodone: NEGATIVE ng/mL (ref <2.5)
Noroxycodone: 3.7 ng/mL — ABNORMAL HIGH (ref <2.5)
Opiates: POSITIVE ng/mL — AB (ref <2.5)
Oxazepam: NEGATIVE ng/mL (ref <0.50)
Oxycodone: 4.8 ng/mL — ABNORMAL HIGH (ref <2.5)
Oxymorphone: NEGATIVE ng/mL (ref <2.5)
Phencyclidine: NEGATIVE ng/mL (ref <10)
Tapentadol: NEGATIVE ng/mL (ref <5.0)
Temazepam: NEGATIVE ng/mL (ref <0.50)
Tramadol: NEGATIVE ng/mL (ref <5.0)
Triazolam: NEGATIVE ng/mL (ref <0.50)
Zolpidem: NEGATIVE ng/mL (ref <5.0)

## 2021-05-19 LAB — DRUG TOX ALC METAB W/CON, ORAL FLD: Alcohol Metabolite: NEGATIVE ng/mL (ref <25)

## 2021-05-19 LAB — DRUG TOX METHYLPHEN W/CONF,ORAL FLD: Methylphenidate: NEGATIVE ng/mL (ref <1.0)

## 2021-05-21 ENCOUNTER — Telehealth: Payer: Self-pay | Admitting: *Deleted

## 2021-05-21 NOTE — Telephone Encounter (Signed)
Oral swab drug screen was consistent for prescribed medications.  ?

## 2021-06-14 ENCOUNTER — Telehealth: Payer: Self-pay

## 2021-06-14 MED ORDER — METHADONE HCL 10 MG PO TABS: 20.0000 mg | ORAL_TABLET | Freq: Two times a day (BID) | ORAL | 0 refills | Status: DC

## 2021-06-14 NOTE — Telephone Encounter (Signed)
Pt called for Methadone 20 mg BID (actually usual takes 15 mg BID) but pill doesn't break well-  #120-  Refilled- is due

## 2021-06-14 NOTE — Telephone Encounter (Signed)
Refill request for Methadone, only one week left in current prescription.

## 2021-07-22 ENCOUNTER — Telehealth: Payer: Self-pay | Admitting: *Deleted

## 2021-07-22 MED ORDER — METHADONE HCL 10 MG PO TABS: 20.0000 mg | ORAL_TABLET | Freq: Two times a day (BID) | ORAL | 0 refills | Status: DC

## 2021-07-22 MED ORDER — OXYCODONE HCL 5 MG PO TABS: 5.0000 mg | ORAL_TABLET | Freq: Four times a day (QID) | ORAL | 0 refills | Status: DC | PRN

## 2021-07-22 NOTE — Telephone Encounter (Signed)
Angela Gilmore called for a refill on her methadone and her oxycodone.  Her last fill dates were 06/14/21 methadone and 05/14/21 for oxycodone. Her next appt is 08/20/21.

## 2021-07-23 ENCOUNTER — Telehealth: Payer: Self-pay

## 2021-07-23 MED ORDER — GABAPENTIN 600 MG PO TABS: 600.0000 mg | ORAL_TABLET | Freq: Three times a day (TID) | ORAL | 5 refills | Status: DC

## 2021-07-23 NOTE — Telephone Encounter (Signed)
Patient called stating she is having severe nerve pain. She is requesting increase in Gabapentin.

## 2021-07-23 NOTE — Telephone Encounter (Signed)
Patient notified

## 2021-08-20 ENCOUNTER — Encounter
Payer: BC Managed Care – PPO | Attending: Physical Medicine and Rehabilitation | Admitting: Physical Medicine and Rehabilitation

## 2021-08-20 ENCOUNTER — Other Ambulatory Visit: Payer: Self-pay

## 2021-08-20 ENCOUNTER — Encounter: Payer: Self-pay | Admitting: Physical Medicine and Rehabilitation

## 2021-08-20 VITALS — BP 95/60 | HR 91 | Ht 66.0 in | Wt 190.4 lb

## 2021-08-20 DIAGNOSIS — R252 Cramp and spasm: Secondary | ICD-10-CM | POA: Diagnosis not present

## 2021-08-20 DIAGNOSIS — G8220 Paraplegia, unspecified: Secondary | ICD-10-CM | POA: Diagnosis not present

## 2021-08-20 DIAGNOSIS — M792 Neuralgia and neuritis, unspecified: Secondary | ICD-10-CM | POA: Diagnosis not present

## 2021-08-20 DIAGNOSIS — G8921 Chronic pain due to trauma: Secondary | ICD-10-CM | POA: Insufficient documentation
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
## 2021-08-20 NOTE — Patient Instructions (Signed)
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 With increased nerve pain.  Biggest limiter is nerve pain. Also tachycardic; Long COVID- new dx.  Pt is here for f/u on Nerve pain and SCI.   Gabapentin- increase Gabapentin to 2400 mg/day- so 600 mg in AM and afternoon and 1200 mg nightly-.   2. Try to reduce Methadone to 10 mg 2x/day- and see if that's tolerable. Won't reduce dose until sees if this makes a difference.   3. Can still increase Gabapentin in future. But weight gain is worse with higher doses- fyi.   4. At level SCI pain is the squeezing- but it responds best to nerve pain meds. If that gets worse, would likely need ot increase gabapentin.   5. Methadone has 58 left; Oxycodone- 75 left- doesn't need refills today.   6. Baclofen has 6 months more refills. As well Duloxetine, has 3 months left on this.    7. Call when needs pain meds refills.   8. Sees Dr Roderic Ovens on 08/29/21- to see him after she sees me.   9. Will have primary order outpt PT  10. Rx for TEDs hose- 20-30 mm Hg- 6 pair x 1 year   11. F/U in 3 months- double appointment.

## 2021-09-15 ENCOUNTER — Telehealth: Payer: Self-pay

## 2021-09-15 MED ORDER — OXYCODONE HCL 5 MG PO TABS: 5.0000 mg | ORAL_TABLET | Freq: Four times a day (QID) | ORAL | 0 refills | Status: DC | PRN

## 2021-09-15 MED ORDER — METHADONE HCL 10 MG PO TABS: 20.0000 mg | ORAL_TABLET | Freq: Two times a day (BID) | ORAL | 0 refills | Status: DC

## 2021-09-15 NOTE — Telephone Encounter (Signed)
Patient called to request refill on Methadone and Oxycodone. Last fill 07/22/21

## 2021-09-15 NOTE — Telephone Encounter (Signed)
Patient called and stated she went to [name] office and she is scheduled to get a root nerve block on 10/01/21 and Infusion therapy on 10/21/21, She states the increase of the Gabapentin and decrease of the Methadone did not help with her nerve pain. She wants to know if the medications need to be changed before her procedures. Also, she wants to know what to do since the increase in Gabapentin and decrease in Methadone did not work. Please advise.

## 2021-09-15 NOTE — Telephone Encounter (Signed)
Go back to previous dose of Methadone and continue increased gabapentin-   And then see what Dr Smitty Cords plan is - and then needs refills for Oxy and Methadone - refilled today- last fill 07/22/21

## 2021-09-24 ENCOUNTER — Telehealth: Payer: Self-pay

## 2021-09-24 MED ORDER — DIAZEPAM 5 MG PO TABS: 5.0000 mg | ORAL_TABLET | Freq: Two times a day (BID) | ORAL | 1 refills | Status: DC | PRN

## 2021-09-24 NOTE — Telephone Encounter (Signed)
Sounds like having severe piriformis mulce spasms- will send in Valium 5 mg 2x/day as needed for muscle sapsms and went over piriformis stretches family can help with.

## 2021-09-24 NOTE — Telephone Encounter (Signed)
Angela Gilmore has had uncontrolled pain for the past 7-8 days. The pain is in both her buttocks, which runs down both legs and into both hips.  She is taking all medications as proscribed. And rotating sides to relieve pressure.  Is there any thing else she can do?   Patient has a pending nerve root block on 10/01/2021.   She has been advised to call [name] office to see she can have her appointment moved up. To see if it will help.  Please advise.  Call back phone [phone]

## 2021-11-16 ENCOUNTER — Telehealth: Payer: Self-pay | Admitting: *Deleted

## 2021-11-16 NOTE — Telephone Encounter (Signed)
Mrs Angela Gilmore called for a refill on her Methadone and valium. Per PMP last filled (methadone) 09/15/21, valium 10/24/21.

## 2021-11-18 ENCOUNTER — Other Ambulatory Visit: Payer: Self-pay | Admitting: Physical Medicine and Rehabilitation

## 2021-11-18 MED ORDER — DIAZEPAM 5 MG PO TABS: 5.0000 mg | ORAL_TABLET | Freq: Two times a day (BID) | ORAL | 5 refills | Status: DC | PRN

## 2021-11-18 MED ORDER — METHADONE HCL 10 MG PO TABS: 20.0000 mg | ORAL_TABLET | Freq: Two times a day (BID) | ORAL | 0 refills | Status: DC

## 2021-11-18 NOTE — Telephone Encounter (Signed)
Patient informed. 

## 2021-11-18 NOTE — Telephone Encounter (Signed)
Angela Gilmore called back. If possible please send both medications to the pharmacy. She is aware you have been out of the office.   Thank you

## 2021-12-10 ENCOUNTER — Ambulatory Visit: Payer: BC Managed Care – PPO | Admitting: Physical Medicine and Rehabilitation

## 2021-12-13 ENCOUNTER — Encounter
Payer: BC Managed Care – PPO | Attending: Physical Medicine and Rehabilitation | Admitting: Physical Medicine and Rehabilitation

## 2021-12-13 ENCOUNTER — Other Ambulatory Visit: Payer: Self-pay

## 2021-12-13 ENCOUNTER — Encounter: Payer: Self-pay | Admitting: Physical Medicine and Rehabilitation

## 2021-12-13 VITALS — BP 121/80 | HR 88 | Temp 98.5°F | Ht 66.0 in | Wt 199.0 lb

## 2021-12-13 DIAGNOSIS — Z5181 Encounter for therapeutic drug level monitoring: Secondary | ICD-10-CM | POA: Diagnosis present

## 2021-12-13 DIAGNOSIS — Z9359 Other cystostomy status: Secondary | ICD-10-CM | POA: Insufficient documentation

## 2021-12-13 DIAGNOSIS — Z79891 Long term (current) use of opiate analgesic: Secondary | ICD-10-CM | POA: Insufficient documentation

## 2021-12-13 DIAGNOSIS — G894 Chronic pain syndrome: Secondary | ICD-10-CM | POA: Insufficient documentation

## 2021-12-13 DIAGNOSIS — G8921 Chronic pain due to trauma: Secondary | ICD-10-CM | POA: Insufficient documentation

## 2021-12-13 DIAGNOSIS — G8220 Paraplegia, unspecified: Secondary | ICD-10-CM | POA: Insufficient documentation

## 2021-12-13 MED ORDER — BACLOFEN 20 MG PO TABS: 40.0000 mg | ORAL_TABLET | Freq: Four times a day (QID) | ORAL | 11 refills | Status: DC

## 2021-12-13 MED ORDER — NALOXONE HCL 4 MG/0.1ML NA LIQD: NASAL | 5 refills | Status: DC

## 2021-12-13 MED ORDER — DIAZEPAM 5 MG PO TABS: 5.0000 mg | ORAL_TABLET | Freq: Two times a day (BID) | ORAL | 5 refills | Status: DC

## 2021-12-13 MED ORDER — GABAPENTIN 400 MG PO CAPS: 400.0000 mg | ORAL_CAPSULE | Freq: Four times a day (QID) | ORAL | 5 refills | Status: DC

## 2021-12-13 NOTE — Patient Instructions (Addendum)
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 With increased nerve pain.  Biggest limiter is nerve pain. Also tachycardic; Long COVID- new dx.  Pt is here for f/u on Nerve pain and SCI.    Valium 5 mg BID- scheduled- and sent in with 60 tabs- with 5 refills. I wrote for 30 tabs at last Rx and so didn't give her enough pills.   2. Go back to Methadone 15 mg 2x/day- for nerve/chronic pain. Shouldn't need refill yet.   3. Obviously, no alcohol with current meds. She's doesn't drink at all.    4. Narcan nasal spray - to use if overdose on opioids. -  5. Oxycodone- 2-3 pills of oxy/day. Base don other meds she's taking. Still has 70 pills left. Doesn't need refill.   6. Decrease gabapentin 400 mg in AM and afternoon; 800 mg nightly- for nerve pain.   7. Refill Baclofen 40 mg 4x/day- for spasticity- 11 refills.    8. Don't want to add Dantrolene since no other spasticity except toes- don't want hammer toes braces due to risk of skin breakdown.   9. Look into the boots and let me know. For toe curling?  10.  F/U in 3 months- double appt- SCI   FYI, spinal cord stimulator stops you from getting MRIs.

## 2021-12-13 NOTE — Progress Notes (Signed)
Subjective:    Patient ID: Angela Gilmore, adult    DOB: June 08, 1966, 56 y.o.   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 With increased nerve pain.  Biggest limiter is nerve pain. Also tachycardic; Long COVID- new dx.  Pt is here for f/u on Nerve pain and SCI.   Has seen Dr Roderic Ovens- Had a nerve root block- T7/8/9- still has numbness but no pain.  But above and below, has the pain.  Next course of action- will do again; and burn those nerves. And then move to different location.   Tried ketamine infusion- 12/1- did nothing-   Sees  a  different person every time- seeing the fellows- and Dr Roderic Ovens-  Hasn't talked to Dr Roderic Ovens since- saw a different woman this last visit.  Gave her a pamphlet- about epidural?- trial for 7 days- spinal cord stimulator.   If can get her butt to quit hurting, would be happy!  Tried Valium for getting her OOB- did 1 tab 2x/day; now taking 1/2 tab 2x/day- butt pain is back.  Got better much better after started Valium 5 mg BID- but I wrote Rx wrong and only gave 30 tabs- so reduced to 1/2 tab to make it through and pain came back.   Doesn't think taking methadone right-  Taking 10 mg BID- not 15 mg BID.  Wasn't taking as many Oxycodone since Valium- was concerned- taking Oxycodone- last filled in October- only 50 in last few months.  Cannot stay awake with Gabapentin 600 mg  4x/day and too sedating- Takes 600/600/1200. 5:30 am and 2pm- so sleepy- and hasn't gotten used to it.  Didn't make pain a lot better   Has medicare and insurance now- helping on copays- still doing therapy.   Pain Inventory Average Pain 7 Pain Right Now 5 My pain is constant, burning, and NUMB, BURNS  LOCATION OF PAIN  BACK, BUTTOCK  BOWEL Number of stools per week: 4 Oral laxative use Yes  Type of laxative Doculax Enema  or suppository use Yes  History of colostomy No  Incontinent No   BLADDER Suprapubic  Mobility use a wheelchair needs help with transfers Do you have any goals in this area?  yes  Function disabled: date disabled 2020 I need assistance with the following:  dressing, bathing, toileting, meal prep, household duties, and shopping Do you have any goals in this area?  yes  Neuro/Psych weakness numbness tremor trouble walking spasms depression anxiety  Prior Studies Any changes since last visit?  no  Physicians involved in your care Any changes since last visit?  no   Family History  Problem Relation Age of Onset   Heart disease Father    Emphysema Father    Breast cancer Sister    Mental illness Sister    Stroke Brother    Mental illness Brother    Social History   Socioeconomic History   Marital status: Unknown    Spouse name: Not on file   Number of children: Not on file   Years of education: Not on file   Highest education level: Not on file  Occupational History   Not on file  Tobacco Use   Smoking status: Former   Smokeless tobacco: Never  Vaping Use   Vaping Use: Never used  Substance and Sexual Activity  Alcohol use: Not Currently   Drug use: Never   Sexual activity: Not on file  Other Topics Concern   Not on file  Social History Narrative   Not on file   Social Determinants of Health   Financial Resource Strain: Not on file  Food Insecurity: Not on file  Transportation Needs: Not on file  Physical Activity: Not on file  Stress: Not on file  Social Connections: Not on file   Past Surgical History:  Procedure Laterality Date   ABDOMINAL SURGERY     BREAST LUMPECTOMY     CARPAL TUNNEL RELEASE     HIP ARTHROSCOPY W/ LABRAL REPAIR     KNEE ARTHROPLASTY     PARTIAL HYSTERECTOMY     ROTATOR CUFF REPAIR     SPINAL FIXATION SURGERY     THORACOTOMY     TONSILECTOMY/ADENOIDECTOMY WITH MYRINGOTOMY     TRIGGER FINGER RELEASE     WISDOM  TOOTH EXTRACTION     Past Medical History:  Diagnosis Date   Hypoglycemia    Paraplegia (HCC)    There were no vitals taken for this visit.  Opioid Risk Score:   Fall Risk Score:  `1  Depression screen PHQ 2/9  Depression screen [facility] 2/9 08/20/2021 05/14/2021 01/04/2021 05/01/2020  Decreased Interest 1 1 0 0  Down, Depressed, Hopeless 1 1 0 0  PHQ - 2 Score 2 2 0 0    Review of Systems  Cardiovascular:        Increased heart rate  Musculoskeletal:  Positive for back pain.       Paraplegia  Psychiatric/Behavioral:         Depression, anxiety  All other systems reviewed and are negative.     Objective:   Physical Exam  Awake, alert, appropriate, in pressure relief all the time for visit; in power wheelchair, NAD Neuro: curling of toes B/L- but otherwise, MAS of 0 in LE's- no clonus B/L-   BZ:JIRCVEL monthly Has SPC in place- medium amber urine- RN changes it.   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 With increased nerve pain.  Biggest limiter is nerve pain. Also tachycardic; Long COVID- new dx.  Pt is here for f/u on Nerve pain and SCI.    Valium 5 mg BID- scheduled- and sent in with 60 tabs- with 5 refills. I wrote for 30 tabs at last Rx and so didn't give her enough pills.   2. Go back to Methadone 15 mg 2x/day- for nerve/chronic pain. Shouldn't need refill yet.   3. Obviously, no alcohol with current meds. She's doesn't drink at all.    4. Narcan nasal spray - to use if overdose on opioids. -  5. Oxycodone- 2-3 pills of oxy/day. Base don other meds she's taking. Still has 70 pills left. Doesn't need refill.   6. Decrease gabapentin 400 mg in AM and afternoon; 800 mg nightly- for nerve pain.   7. Refill Baclofen 40 mg 4x/day- for spasticity- 11 refills.    8. Don't want to add Dantrolene  since no other spasticity except toes- don't want hammer toes braces due to risk of skin breakdown.   9. Look into the boots and let me know. For toe curling?  10.  F/U in 3 months- double  appt- SCI   I spent a total of 31 minutes on total visit- specifically educating on current meds and narcan; and discussing Dr Smitty CordsNorth's plan- FYI, spinal cord stimulator stops you from getting MRIs.

## 2021-12-16 LAB — DRUG TOX MONITOR 1 W/CONF, ORAL FLD
Alprazolam: NEGATIVE ng/mL (ref <0.50)
Amphetamines: NEGATIVE ng/mL (ref <10)
Barbiturates: NEGATIVE ng/mL (ref <10)
Benzodiazepines: POSITIVE ng/mL — AB (ref <0.50)
Buprenorphine: NEGATIVE ng/mL (ref <0.10)
Chlordiazepoxide: NEGATIVE ng/mL (ref <0.50)
Clonazepam: NEGATIVE ng/mL (ref <0.50)
Cocaine: NEGATIVE ng/mL (ref <5.0)
Codeine: NEGATIVE ng/mL (ref <2.5)
Diazepam: 2.58 ng/mL — ABNORMAL HIGH (ref <0.50)
Dihydrocodeine: NEGATIVE ng/mL (ref <2.5)
EDDP: NEGATIVE ng/mL (ref <5.0)
Fentanyl: NEGATIVE ng/mL (ref <0.10)
Flunitrazepam: NEGATIVE ng/mL (ref <0.50)
Flurazepam: NEGATIVE ng/mL (ref <0.50)
Heroin Metabolite: NEGATIVE ng/mL (ref <1.0)
Hydrocodone: NEGATIVE ng/mL (ref <2.5)
Hydromorphone: NEGATIVE ng/mL (ref <2.5)
Lorazepam: NEGATIVE ng/mL (ref <0.50)
MARIJUANA: NEGATIVE ng/mL (ref <2.5)
MDMA: NEGATIVE ng/mL (ref <10)
Meprobamate: NEGATIVE ng/mL (ref <2.5)
Methadone: 218.2 ng/mL — ABNORMAL HIGH (ref <5.0)
Methadone: POSITIVE ng/mL — AB (ref <5.0)
Midazolam: NEGATIVE ng/mL (ref <0.50)
Morphine: NEGATIVE ng/mL (ref <2.5)
Nicotine Metabolite: NEGATIVE ng/mL (ref <5.0)
Nordiazepam: 7.31 ng/mL — ABNORMAL HIGH (ref <0.50)
Norhydrocodone: NEGATIVE ng/mL (ref <2.5)
Noroxycodone: 45.5 ng/mL — ABNORMAL HIGH (ref <2.5)
Opiates: POSITIVE ng/mL — AB (ref <2.5)
Oxazepam: 0.59 ng/mL — ABNORMAL HIGH (ref <0.50)
Oxycodone: 114.4 ng/mL — ABNORMAL HIGH (ref <2.5)
Oxymorphone: NEGATIVE ng/mL (ref <2.5)
Phencyclidine: NEGATIVE ng/mL (ref <10)
Tapentadol: NEGATIVE ng/mL (ref <5.0)
Temazepam: 0.6 ng/mL — ABNORMAL HIGH (ref <0.50)
Tramadol: NEGATIVE ng/mL (ref <5.0)
Triazolam: NEGATIVE ng/mL (ref <0.50)
Zolpidem: NEGATIVE ng/mL (ref <5.0)

## 2021-12-16 LAB — DRUG TOX ALC METAB W/CON, ORAL FLD: Alcohol Metabolite: NEGATIVE ng/mL (ref <25)

## 2021-12-21 ENCOUNTER — Telehealth: Payer: Self-pay | Admitting: *Deleted

## 2021-12-21 NOTE — Telephone Encounter (Signed)
Oral swab drug screen was consistent for prescribed medications.  ?

## 2022-01-11 ENCOUNTER — Other Ambulatory Visit: Payer: Self-pay | Admitting: Physical Medicine and Rehabilitation

## 2022-01-11 MED ORDER — METHADONE HCL 10 MG PO TABS: 20.0000 mg | ORAL_TABLET | Freq: Two times a day (BID) | ORAL | 0 refills | Status: DC

## 2022-02-24 ENCOUNTER — Encounter: Payer: Self-pay | Admitting: Physical Medicine and Rehabilitation

## 2022-02-28 MED ORDER — PREDNISONE 10 MG PO TABS: 60.0000 mg | ORAL_TABLET | Freq: Every day | ORAL | 0 refills | Status: DC

## 2022-03-02 ENCOUNTER — Other Ambulatory Visit: Payer: Self-pay | Admitting: Physical Medicine and Rehabilitation

## 2022-03-02 MED ORDER — METHADONE HCL 10 MG PO TABS: ORAL_TABLET | ORAL | 0 refills | Status: DC

## 2022-03-02 MED ORDER — OXYCODONE HCL 5 MG PO TABS: 5.0000 mg | ORAL_TABLET | Freq: Three times a day (TID) | ORAL | 0 refills | Status: DC

## 2022-03-02 NOTE — Telephone Encounter (Signed)
Dr Berline Chough note was reviewed.  ?PMP was Reviewed.  ?Placed a call to Ms. Rosenbach she states she was taking a Methadone one tablet twice a day until a few weeks ago, she is taking the Methadone 1 1/2 tablet twice a day as noted in Dr Rodell Perna note and taking her Oxycodone.  ?Last fill date of Oxycodone was on 09/15/2021 ?Methadone and Oxycodone -scribed today, Ms. Jacquin is aware of the above and verbalizes understanding.  ?

## 2022-03-03 ENCOUNTER — Telehealth: Payer: Self-pay | Admitting: *Deleted

## 2022-03-03 ENCOUNTER — Encounter: Payer: Self-pay | Admitting: Physical Medicine and Rehabilitation

## 2022-03-03 NOTE — Telephone Encounter (Signed)
Prior auth submitted to insurance [employer] via CoverMyMeds for methadone. ?

## 2022-03-07 ENCOUNTER — Encounter: Payer: Self-pay | Admitting: *Deleted

## 2022-03-07 NOTE — Telephone Encounter (Signed)
Prior auth approved 03/03/22- 09/02/22. Pharmacy and patient notified. ?

## 2022-03-16 ENCOUNTER — Telehealth: Payer: Self-pay

## 2022-03-16 ENCOUNTER — Encounter
Payer: BC Managed Care – PPO | Attending: Physical Medicine and Rehabilitation | Admitting: Physical Medicine and Rehabilitation

## 2022-03-16 ENCOUNTER — Encounter: Payer: Self-pay | Admitting: Physical Medicine and Rehabilitation

## 2022-03-16 VITALS — BP 108/71 | HR 86 | Ht 66.0 in | Wt 199.0 lb

## 2022-03-16 DIAGNOSIS — G8921 Chronic pain due to trauma: Secondary | ICD-10-CM | POA: Insufficient documentation

## 2022-03-16 DIAGNOSIS — G8220 Paraplegia, unspecified: Secondary | ICD-10-CM | POA: Diagnosis not present

## 2022-03-16 DIAGNOSIS — M792 Neuralgia and neuritis, unspecified: Secondary | ICD-10-CM | POA: Diagnosis not present

## 2022-03-16 DIAGNOSIS — Z993 Dependence on wheelchair: Secondary | ICD-10-CM | POA: Insufficient documentation

## 2022-03-16 MED ORDER — TAPENTADOL HCL ER 100 MG PO TB12: 100.0000 mg | ORAL_TABLET | Freq: Two times a day (BID) | ORAL | 0 refills | Status: DC

## 2022-03-16 NOTE — Telephone Encounter (Signed)
PA for Nucynta submitted. ?

## 2022-03-16 NOTE — Progress Notes (Signed)
? ?Subjective:  ? ? Patient ID: Angela Gilmore, adult    DOB: [birth year], 56 y.o.   MRN: [id number] ? 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 With increased nerve pain. ? Biggest limiter is nerve pain. Also tachycardic; Long COVID- new dx.  ?Pt is here for f/u on Nerve pain and SCI.  ? ?Pain has gotten so bad- lives life in bed! ? ?And has no one to roll her and has to stay in pressure relief position. It hurts her back and cries all day.  ? ?Wanted to go to hospital and put her to sleep, pain is so bad! ? ?Weight gain is so bad; and is miserable.  ?Started putting weights back on hands- "wear on hands".  ? ?Talked to PCP about weight gain and pain-  ?Having issues with CBG's- tried to get her on Ozempic- insurance wouldn't approve it.  ?Feels like fighting a losing battle with weight/CBGs/pain.  ? ?Cannot take a weight loss med- like caffeine- raises her heart rate.  HR runs 100-120 at rest all the time- without any meds.  ? ?Very irritable and short tempered and cries all the time- frustrating for pt and husband.  ? ?When did prednisone earlier in month, didn't help at all.  ?Went up to 60 mg daily.  ? ?Went back to take Valium q12 hours and pain pills in between- didn't help per pt.  ? ?Hasn't tried Nucynta.  ? ?Pain Inventory ?Average Pain 10 ?Pain Right Now 10 ?My pain is burning and tingling ? ?LOCATION OF PAIN  back buttocks hip and thigh ? ?BOWEL ?Number of stools per week: 3-4 ?Oral laxative use Yes  ?Type of laxative miralax and stool softners and dulcolax ?Enema or suppository use Yes  ? ? ?BLADDER ?Suprapubic ? ? ?Mobility ?use a wheelchair ?needs help with transfers ? ?Function ?disabled: date disabled 03/02/2019 ?I need assistance with the following:  dressing, bathing, toileting, meal prep, household duties, and  shopping ? ?Neuro/Psych ?numbness ?tingling ?spasms ?depression ?anxiety ? ?Prior Studies ?Any changes since last visit?  no ? ?Physicians involved in your care ?Any changes since last visit?  no ? ? ?Family History  ?Problem Relation Age of Onset  ? Heart disease Father   ? Emphysema Father   ? Breast cancer Sister   ? Mental illness Sister   ? Stroke Brother   ? Mental illness Brother   ? ?Social History  ? ?Socioeconomic History  ? Marital status: Unknown  ?  Spouse name: Not on file  ? Number of children: Not on file  ? Years of education: Not on file  ? Highest education level: Not on file  ?Occupational History  ? Not on file  ?Tobacco Use  ? Smoking status: Former  ? Smokeless tobacco: Never  ?Vaping Use  ? Vaping Use: Never used  ?Substance and Sexual Activity  ? Alcohol use: Not Currently  ? Drug use: Never  ? Sexual activity: Not on file  ?Other Topics Concern  ? Not on file  ?Social History Narrative  ? Not on file  ? ?Social Determinants of Health  ? ?Financial Resource Strain: Not on file  ?Food Insecurity: Not on file  ?Transportation Needs: Not on file  ?Physical Activity: Not on file  ?Stress: Not on file  ?Social Connections: Not on file  ? ?Past Surgical History:  ?  Procedure Laterality Date  ? ABDOMINAL SURGERY    ? BREAST LUMPECTOMY    ? CARPAL TUNNEL RELEASE    ? HIP ARTHROSCOPY W/ LABRAL REPAIR    ? KNEE ARTHROPLASTY    ? PARTIAL HYSTERECTOMY    ? ROTATOR CUFF REPAIR    ? SPINAL FIXATION SURGERY    ? THORACOTOMY    ? TONSILECTOMY/ADENOIDECTOMY WITH MYRINGOTOMY    ? TRIGGER FINGER RELEASE    ? WISDOM TOOTH EXTRACTION    ? ?Past Medical History:  ?Diagnosis Date  ? Hypoglycemia   ? Paraplegia (HCC)   ? ?BP 108/71   Pulse 86   Ht 5\' 6"  (1.676 m)   Wt 199 lb (90.3 kg) Comment: last reported  SpO2 98%   BMI 32.12 kg/m?  ? ?Opioid Risk Score:   ?Fall Risk Score:  `1 ? ?Depression screen PHQ 2/9 ? ? ?  03/16/2022  ? 10:37 AM 12/13/2021  ? 10:26 AM 08/20/2021  ?  9:23 AM 05/14/2021  ? 10:13 AM  01/04/2021  ?  9:53 AM 05/01/2020  ? 10:23 AM  ?Depression screen PHQ 2/9  ?Decreased Interest 2 1 1 1  0 0  ?Down, Depressed, Hopeless 2 1 1 1  0 0  ?PHQ - 2 Score 4 2 2 2  0 0  ?  ? ?Review of Systems  ?Constitutional:  Positive for diaphoresis and unexpected weight change.  ?     Gain  ?HENT: Negative.    ?Eyes: Negative.   ?Respiratory:  Positive for shortness of breath.   ?Cardiovascular: Negative.   ?Gastrointestinal: Negative.   ?Endocrine:  ?     High blood sugars  ?Genitourinary: Negative.   ?Musculoskeletal:  Positive for back pain.  ?Skin: Negative.   ?Allergic/Immunologic: Negative.   ?Neurological:   ?     Burning and tingling  ?Hematological: Negative.   ?Psychiatric/Behavioral:  Positive for dysphoric mood. The patient is nervous/anxious.   ?All other systems reviewed and are negative. ? ?   ?Objective:  ? Physical Exam ? ?Awake, alert, crying intermittently- accompanied by husband;  ?LE edema; has gained a few pounds;  ?In power w/c.  ? ? ?   ?Assessment & Plan:  ? 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 With increased nerve pain. ? Biggest limiter is nerve pain. Also tachycardic; Long COVID- new dx.  ?Pt is here for f/u on Nerve pain and SCI.  ? ? ?Nucynta 100 mg BID- ER- since insurance won't cover short acting Nucynta. Have literally tried EVERYTHING- methadone, oxy; Belbuca, Low dose naltrexone; Duloxetine' gabapentin, Lyrica, Trileptal and  ?2. Can eat protein- moderation is key- just don't gorge on protein- all will turn to fat if have excess calories.  ? ? ?3. Talked to Dr Roderic OvensNorth and [facility] discuss - spinal cord stimulator- with Spinal cord pain pump with Prialt. D/w with Dr Roderic OvensNorth as well.  ?Will also discuss with Dr Vivien RossettiElmers- she suggested compounded gabapentin and Spinal cord pump/Prialt.  ? ? ?4. Can compound gabapentin/lidocaine-  will let me know for standard for gabapentin gel.  ?5-6% with 2-5% lidocaine - will get compounded at [facility]- [phone] ?6% gabapentin and 5% lidocaine- calle din with 5 refills ? ?5. F/U- 3 months- double appt.  ? ?6. Just trying to get some quality of life.  ? ?7.  On methadone/Oxycodone- continue- don't change dose right now.  ? ?I spent a total of  41  minutes on total care today- >50% coordination of care- due to calling and speaking with 2 doctors and pharmacy.  ? ? ?

## 2022-03-16 NOTE — Patient Instructions (Signed)
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 With increased nerve pain. ? Biggest limiter is nerve pain. Also tachycardic; Long COVID- new dx.  ?Pt is here for f/u on Nerve pain and SCI.  ? ? ?Nucynta 100 mg BID- ER- since insurance won't cover short acting Nucynta. Have literally tried EVERYTHING- methadone, oxy; Belbuca, Low dose naltrexone; Duloxetine' gabapentin, Lyrica, Trileptal and  ?2. Can eat protein- moderation is key- just don't gorge on protein- all will turn to fat if have excess calories.  ? ? ?3. Talked to Dr Mechele Dawley and also L/M for SCI doctor at [facility] to further discuss - spinal cord stimulator- with Spinal cord pain pump with Prialt. D/w with Dr Mechele Dawley as well.  ?Will also discuss with Dr Lovey Newcomer- she suggested compounded gabapentin and Spinal cord pump/Prialt.  ? ? ?4. Can compound gabapentin/lidocaine- will let me know for standard for gabapentin gel.  ?5-6% with 2-5% lidocaine - will get compounded at [facility]- [phone] ?6% gabapentin and 5% lidocaine- calle din with 5 refills ? ?5. F/U- 3 months- double appt.  ? ?6. Just trying to get some quality of life.  ?

## 2022-03-21 NOTE — Telephone Encounter (Signed)
Nucynta denied  ? ?

## 2022-03-23 NOTE — Telephone Encounter (Signed)
Is there any way to Appeal it? We've literally tried every medicine out there- can yo u let me know- ML

## 2022-03-24 NOTE — Telephone Encounter (Signed)
You sure can. You have 180 days to appeal it. ?

## 2022-04-04 ENCOUNTER — Telehealth: Payer: Self-pay | Admitting: *Deleted

## 2022-04-04 NOTE — Telephone Encounter (Signed)
Mrs Schroader is requesting a refill on her methadone. She has enough to end of the week. Dr Dagoberto Ligas is out of the office. ?

## 2022-04-06 MED ORDER — METHADONE HCL 10 MG PO TABS: ORAL_TABLET | ORAL | 0 refills | Status: DC

## 2022-04-06 NOTE — Telephone Encounter (Signed)
PMP was Reviewed.  ?Dr Berline Chough note was reviewed.  ?Methadon e-scribed today.  ?Placed a call to Ms. Fullington, no answer, left message to return the call.  ?

## 2022-04-06 NOTE — Telephone Encounter (Signed)
Patient advised to call the pharmacy to make sure  Rx is available for pickup. ?

## 2022-05-03 ENCOUNTER — Other Ambulatory Visit: Payer: Self-pay | Admitting: Physical Medicine and Rehabilitation

## 2022-05-03 MED ORDER — METHADONE HCL 10 MG PO TABS: ORAL_TABLET | ORAL | 0 refills | Status: DC

## 2022-05-09 ENCOUNTER — Telehealth: Payer: Self-pay

## 2022-05-09 NOTE — Telephone Encounter (Signed)
Attempted to do a PA for Methadone and the PA has been resolved, no additional PA is required. For further inquiries please contact the number on the back of the member prescription card per CoverMyMeds

## 2022-05-09 NOTE — Telephone Encounter (Signed)
I don't know what the hold up is- but I would pay cash, then call insurance to figure out what the hold up is- and we cna hopefully fix it next time- it's usually pretty reasonable- ML

## 2022-05-09 NOTE — Telephone Encounter (Signed)
Pt.notified

## 2022-05-09 NOTE — Telephone Encounter (Signed)
No Methadone completely out. Insurance is holding up, cover my meds says no PA required.  Patient states extreme nerve pain, been in bed for 10 days. Waiting on Dr. Roderic Ovens about stimulator. Please advise.

## 2022-06-03 ENCOUNTER — Other Ambulatory Visit: Payer: Self-pay

## 2022-06-03 NOTE — Telephone Encounter (Signed)
  Dr. Berline Chough is on pal. Will you send in the refill?[facility]  Written  ID  Drug  QTY  Days  Prescriber  RX #  Dispenser  Refill  Daily Dose*  Pymt Type  PMP  05/29/2022 12/13/2021 1  Diazepam 5 Mg Tablet 60.00 30 Me Lov [id number] Nor (1838) 3/5 1.00 LMET Comm Ins [place] 05/09/2022 05/03/2022 1  Methadone Hcl 10 Mg Tablet 90.00 30 Me Lov [id number] Nor (1838) 0/0 90.00 MMET Private Pay [place]

## 2022-06-06 MED ORDER — METHADONE HCL 10 MG PO TABS: ORAL_TABLET | ORAL | 0 refills | Status: DC

## 2022-06-06 NOTE — Telephone Encounter (Signed)
Dr Berline Chough note was reviewed.  PMP was Reviewed.  Methadone e-scribed today.

## 2022-06-10 ENCOUNTER — Telehealth: Payer: Self-pay

## 2022-06-10 NOTE — Telephone Encounter (Signed)
Patient stated she is paying for the Methadone out of pocket. No need to to do the PA. Patient has been advised to please, ask the pharmacy not to run her insurance when doing so.

## 2022-07-05 ENCOUNTER — Telehealth: Payer: Self-pay

## 2022-07-05 MED ORDER — METHADONE HCL 10 MG PO TABS: ORAL_TABLET | ORAL | 0 refills | Status: DC

## 2022-07-05 NOTE — Telephone Encounter (Signed)
PMP was Reviewed Dr Berline Chough Note was reviewed.  Methadone e-scribed Patient needs a Drug Screen , she has a visit this month.

## 2022-07-05 NOTE — Telephone Encounter (Signed)
PMP REPORT:   Filled  Written  ID  Drug  QTY  Days  Prescriber  RX #  Dispenser  Refill  Daily Dose*  Pymt Type  PMP  06/06/2022 06/06/2022 1  Methadone Hcl 10 Mg Tablet 90.00 30 Eu Tho [id number] Nor (1838) 0/0 141.00 MME Private Pay [place]  Please advise: Dr. Berline Chough is out of the office this week. Per patient she has maybe a 5 day supply of pain medication on hand.

## 2022-07-13 ENCOUNTER — Encounter: Payer: Self-pay | Admitting: Physical Medicine and Rehabilitation

## 2022-07-13 ENCOUNTER — Encounter
Payer: BC Managed Care – PPO | Attending: Physical Medicine and Rehabilitation | Admitting: Physical Medicine and Rehabilitation

## 2022-07-13 VITALS — BP 135/87 | HR 73 | Ht 66.0 in | Wt 197.8 lb

## 2022-07-13 DIAGNOSIS — G894 Chronic pain syndrome: Secondary | ICD-10-CM | POA: Insufficient documentation

## 2022-07-13 DIAGNOSIS — Z79891 Long term (current) use of opiate analgesic: Secondary | ICD-10-CM | POA: Insufficient documentation

## 2022-07-13 DIAGNOSIS — Z5181 Encounter for therapeutic drug level monitoring: Secondary | ICD-10-CM | POA: Diagnosis present

## 2022-07-13 MED ORDER — DIAZEPAM 5 MG PO TABS: 5.0000 mg | ORAL_TABLET | Freq: Every day | ORAL | 5 refills | Status: DC

## 2022-07-13 MED ORDER — BACLOFEN 20 MG PO TABS: 20.0000 mg | ORAL_TABLET | Freq: Four times a day (QID) | ORAL | 11 refills | Status: DC

## 2022-07-13 MED ORDER — DULOXETINE HCL 60 MG PO CPEP: ORAL_CAPSULE | ORAL | 11 refills | Status: DC

## 2022-07-13 MED ORDER — GABAPENTIN 400 MG PO CAPS: 400.0000 mg | ORAL_CAPSULE | Freq: Four times a day (QID) | ORAL | 5 refills | Status: DC

## 2022-07-13 NOTE — Patient Instructions (Signed)
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 With increased nerve pain.  Biggest limiter is nerve pain. Also tachycardic; Long COVID- new dx.  Pt is here for f/u on Nerve pain and SCI.     SCI support group- [address] 6-7 pm on last Thursday of the month- can get on email/phone list if comes.   2. Will change Valium to 5 mg QHS for spasticity.    3. Will con't oxycodone- last refill 4/23- but doesn't need more oxy right now.    4. Con't Methadone 15 mg BID last filled 07/05/22-   5. Cont gabapentin 400 mg QID- for nerve pain- needs refills- sent in.   6. If has autonomic dysreflexia- tight clothes; bowel and bladder are 80% of cause- but ANY noxious stimulus can do it- AD causes a sense of impending doom, anxiety,  and severe horrific headache like nothing have  7. Don't want a BP medicine to bring it down, if you go to ER, because it could drop BP too low- give Nitroglycerin or nitropaste- can cause headache, First treat is sit up- loosen tight clothes; deal with bowel and bladder- and strip her- to look for skin issues.    8. Can use air mattress/hospital bed- see which is better for skin/comfort-  9. Discussed intimacy  10. F/U in 4months- double appointment-SCI

## 2022-07-13 NOTE — Progress Notes (Signed)
Subjective:    Patient ID: Angela Gilmore, adult    DOB: [birth year], 56 y.o.   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 With increased nerve pain.  Biggest limiter is nerve pain. Also tachycardic; Long COVID- new dx.  Pt is here for f/u on Nerve pain and SCI.    Now raising 4 children- Daughter got into a mess- so now they are guardians.  17-22 years old. Got them 05/26/22  Has been a lot on pt/husband.   Not doing so well.  Still battling nerve pain-   Insurance wouldn't cover Nucynta- even after prior authorization.  Even keeps requiring prior auth on methadone- just pays $21 with good Rx, so not using insurance for methadone. .   Has appointment next week for trail of spinal cord stimulator- to wear for 7 days.   Compounded gabapentin/lidocaine did NOTHING- zero.   After stayed in bed for 2.5 months, feels like pain got a tiny bit better- but coming back.   Not quite as bad, but still pretty bad.   PCP tried to put on Ozempic since meets criteria for DM now.   Had another injection scheduled, but wants to follow through with [facility] stimulator first.    Hasn't been taking Oxycodone much- doesn't like to mix with Valium- makes her too sleepy.  Tries not to take Oxycodone.    Taking Methadone BID 15 mg BID And takes gabapentin 400 mg TID Takes Valium just at night- 5 mg QHS- helps with spasms.   Only taking Baclofen 20 mg 4 tabs/day-  Wants to reduce spasms/meds- so only takes 20 mg 4x/day.   Had a night, BP was so high- around 200/100-  Couldn't find why it was set off/Autonomic dysreflexia.  Just had done bowel program;  Had horrific HA due to AD- was in a lot of pain- butt was killing her form nerve pain- back and butt- like sitting on concrete that was 110 degrees- and even feet hurt from nerve pain.  Felt so bad, almost called  EMS.   Pain Inventory Average Pain 8 Pain Right Now 5 My pain is burning and tingling  LOCATION OF PAIN  Buttocks, thighs, lower back  BOWEL Number of stools per week: 4-5 Oral laxative use Yes  Type of laxative stool softeners, Miralax Enema or suppository use Yes    BLADDER Suprapubic    Mobility use a wheelchair needs help with transfers  Function I need assistance with the following:  dressing, bathing, toileting, meal prep, household duties, and shopping  Neuro/Psych numbness tremor tingling trouble walking spasms depression  Prior Studies Any changes since last visit?  no  Physicians involved in your care Any changes since last visit?  no   Family History  Problem Relation Age of Onset   Heart disease Father    Emphysema Father    Breast cancer Sister    Mental illness Sister    Stroke Brother    Mental illness Brother    Social History   Socioeconomic History   Marital status: Unknown    Spouse name: Not on file   Number of children: Not on file   Years of education: Not on file   Highest education level: Not on file  Occupational History   Not on file  Tobacco Use   Smoking status: Former  Smokeless tobacco: Never  Vaping Use   Vaping Use: Never used  Substance and Sexual Activity   Alcohol use: Not Currently   Drug use: Never   Sexual activity: Not on file  Other Topics Concern   Not on file  Social History Narrative   Not on file   Social Determinants of Health   Financial Resource Strain: Not on file  Food Insecurity: Not on file  Transportation Needs: Not on file  Physical Activity: Not on file  Stress: Not on file  Social Connections: Not on file   Past Surgical History:  Procedure Laterality Date   ABDOMINAL SURGERY     BREAST LUMPECTOMY     CARPAL TUNNEL RELEASE     HIP ARTHROSCOPY W/ LABRAL REPAIR     KNEE ARTHROPLASTY     PARTIAL HYSTERECTOMY     ROTATOR CUFF REPAIR     SPINAL FIXATION SURGERY      THORACOTOMY     TONSILECTOMY/ADENOIDECTOMY WITH MYRINGOTOMY     TRIGGER FINGER RELEASE     WISDOM TOOTH EXTRACTION     Past Medical History:  Diagnosis Date   Hypoglycemia    Paraplegia (HCC)    Ht 5\' 6"  (1.676 m)   Wt 197 lb 12.8 oz (89.7 kg)   BMI 31.93 kg/m   Opioid Risk Score:   Fall Risk Score:  `1  Depression screen [facility] 2/9     07/13/2022   10:11 AM 03/16/2022   10:37 AM 12/13/2021   10:26 AM 08/20/2021    9:23 AM 05/14/2021   10:13 AM 01/04/2021    9:53 AM 05/01/2020   10:23 AM  Depression screen [facility] 2/9  Decreased Interest 3 2 1 1 1  0 0  Down, Depressed, Hopeless 3 2 1 1 1  0 0  PHQ - 2 Score 6 4 2 2 2  0 0     Review of Systems  Constitutional: Negative.   HENT: Negative.    Eyes: Negative.   Respiratory: Negative.    Cardiovascular:  Positive for chest pain and leg swelling.  Gastrointestinal: Negative.   Endocrine: Negative.   Genitourinary: Negative.   Musculoskeletal:  Positive for gait problem.  Skin: Negative.   Allergic/Immunologic: Negative.   Neurological:  Positive for tremors and numbness.  Hematological: Negative.   Psychiatric/Behavioral:  Positive for dysphoric mood.       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 With increased nerve pain.  Biggest limiter is nerve pain. Also tachycardic; Long COVID- new dx.  Pt is here for f/u on Nerve pain and SCI.     SCI support group- [address] 6-7 pm on last Thursday of the month- can get on email/phone list if comes.   2. Will change Valium to 5 mg QHS for spasticity.    3. Will con't oxycodone- last refill 4/23- but doesn't need more oxy right now.    4. Con't Methadone 15 mg BID  last filled 07/05/22-   5. Cont gabapentin 400 mg QID- for nerve pain- needs refills- sent in.   6. If has autonomic dysreflexia- tight clothes; bowel and bladder are 80% of  cause- but ANY noxious stimulus can do it- AD causes a sense of impending doom, anxiety,  and severe horrific headache like nothing have  7. Don't want a BP medicine to bring it down, if you go to ER, because it could drop BP too low- give Nitroglycerin or nitropaste- can cause headache, First treat is sit up- loosen tight clothes; deal with bowel and bladder- and strip her- to look for skin issues.    8. Can use air mattress/hospital bed- see which is better for skin/comfort-  9. Discussed intimacy  10. F/U in 62months- double appointment-SCI

## 2022-07-20 LAB — DRUG TOX MONITOR 1 W/CONF, ORAL FLD
Alprazolam: NEGATIVE ng/mL (ref <0.50)
Amphetamines: NEGATIVE ng/mL (ref <10)
Barbiturates: NEGATIVE ng/mL (ref <10)
Benzodiazepines: POSITIVE ng/mL — AB (ref <0.50)
Buprenorphine: NEGATIVE ng/mL (ref <0.10)
Chlordiazepoxide: NEGATIVE ng/mL (ref <0.50)
Clonazepam: NEGATIVE ng/mL (ref <0.50)
Cocaine: NEGATIVE ng/mL (ref <5.0)
Diazepam: 0.78 ng/mL — ABNORMAL HIGH (ref <0.50)
EDDP: NEGATIVE ng/mL (ref <5.0)
Fentanyl: NEGATIVE ng/mL (ref <0.10)
Flunitrazepam: NEGATIVE ng/mL (ref <0.50)
Flurazepam: NEGATIVE ng/mL (ref <0.50)
Heroin Metabolite: NEGATIVE ng/mL (ref <1.0)
Lorazepam: NEGATIVE ng/mL (ref <0.50)
MARIJUANA: NEGATIVE ng/mL (ref <2.5)
MDMA: NEGATIVE ng/mL (ref <10)
Meprobamate: NEGATIVE ng/mL (ref <2.5)
Methadone: 76 ng/mL — ABNORMAL HIGH (ref <5.0)
Methadone: POSITIVE ng/mL — AB (ref <5.0)
Midazolam: NEGATIVE ng/mL (ref <0.50)
Nicotine Metabolite: NEGATIVE ng/mL (ref <5.0)
Nordiazepam: 2.3 ng/mL — ABNORMAL HIGH (ref <0.50)
Opiates: NEGATIVE ng/mL (ref <2.5)
Oxazepam: NEGATIVE ng/mL (ref <0.50)
Phencyclidine: NEGATIVE ng/mL (ref <10)
Tapentadol: NEGATIVE ng/mL (ref <5.0)
Temazepam: NEGATIVE ng/mL (ref <0.50)
Tramadol: NEGATIVE ng/mL (ref <5.0)
Triazolam: NEGATIVE ng/mL (ref <0.50)
Zolpidem: NEGATIVE ng/mL (ref <5.0)

## 2022-07-20 LAB — DRUG TOX ALC METAB W/CON, ORAL FLD: Alcohol Metabolite: NEGATIVE ng/mL (ref <25)

## 2022-07-22 ENCOUNTER — Telehealth: Payer: Self-pay | Admitting: *Deleted

## 2022-07-22 NOTE — Telephone Encounter (Signed)
Oral swab drug screen was consistent for prescribed medications.  ?

## 2022-08-08 ENCOUNTER — Other Ambulatory Visit: Payer: Self-pay | Admitting: Physical Medicine and Rehabilitation

## 2022-08-08 ENCOUNTER — Telehealth: Payer: Self-pay

## 2022-08-08 MED ORDER — METHADONE HCL 10 MG PO TABS: ORAL_TABLET | ORAL | 0 refills | Status: DC

## 2022-08-08 NOTE — Telephone Encounter (Signed)
Patient is calling for a refill on Methadone 10 mg. She stated that she did not realize that she would run out after her dose tonight. Per PMP, last refill was 07/06/22. Can you please send in Dr. Florentina Jenny absence

## 2022-08-09 ENCOUNTER — Telehealth: Payer: Self-pay

## 2022-08-09 NOTE — Telephone Encounter (Signed)
PA for Methadone sent to insurance through CoverMyMeds 

## 2022-08-11 NOTE — Telephone Encounter (Signed)
[facility] sent a fax stating they do not process prior auth request, pharmacy states the patient has [facility]

## 2022-08-22 DIAGNOSIS — M5414 Radiculopathy, thoracic region: Secondary | ICD-10-CM | POA: Insufficient documentation

## 2022-09-05 ENCOUNTER — Other Ambulatory Visit: Payer: Self-pay | Admitting: Physical Medicine and Rehabilitation

## 2022-09-06 MED ORDER — METHADONE HCL 10 MG PO TABS: ORAL_TABLET | ORAL | 0 refills | Status: DC

## 2022-10-05 ENCOUNTER — Other Ambulatory Visit: Payer: Self-pay | Admitting: Physical Medicine and Rehabilitation

## 2022-10-07 MED ORDER — METHADONE HCL 10 MG PO TABS: ORAL_TABLET | ORAL | 0 refills | Status: DC

## 2022-10-21 ENCOUNTER — Encounter: Payer: Self-pay | Admitting: Registered Nurse

## 2022-10-21 ENCOUNTER — Ambulatory Visit: Payer: BC Managed Care – PPO | Admitting: Physical Medicine and Rehabilitation

## 2022-10-21 ENCOUNTER — Encounter: Payer: BC Managed Care – PPO | Attending: Physical Medicine and Rehabilitation | Admitting: Registered Nurse

## 2022-10-21 VITALS — BP 94/61 | HR 71 | Ht 66.0 in

## 2022-10-21 DIAGNOSIS — G8220 Paraplegia, unspecified: Secondary | ICD-10-CM | POA: Diagnosis present

## 2022-10-21 DIAGNOSIS — G894 Chronic pain syndrome: Secondary | ICD-10-CM | POA: Diagnosis present

## 2022-10-21 DIAGNOSIS — Z5181 Encounter for therapeutic drug level monitoring: Secondary | ICD-10-CM | POA: Insufficient documentation

## 2022-10-21 DIAGNOSIS — Z993 Dependence on wheelchair: Secondary | ICD-10-CM | POA: Diagnosis present

## 2022-10-21 DIAGNOSIS — R252 Cramp and spasm: Secondary | ICD-10-CM | POA: Insufficient documentation

## 2022-10-21 DIAGNOSIS — K592 Neurogenic bowel, not elsewhere classified: Secondary | ICD-10-CM | POA: Insufficient documentation

## 2022-10-21 DIAGNOSIS — N319 Neuromuscular dysfunction of bladder, unspecified: Secondary | ICD-10-CM | POA: Diagnosis present

## 2022-10-21 DIAGNOSIS — Z79891 Long term (current) use of opiate analgesic: Secondary | ICD-10-CM | POA: Insufficient documentation

## 2022-10-21 DIAGNOSIS — M792 Neuralgia and neuritis, unspecified: Secondary | ICD-10-CM | POA: Diagnosis present

## 2022-10-21 MED ORDER — DIAZEPAM 5 MG PO TABS: 5.0000 mg | ORAL_TABLET | Freq: Every day | ORAL | 5 refills | Status: DC

## 2022-10-21 MED ORDER — GABAPENTIN 400 MG PO CAPS: 400.0000 mg | ORAL_CAPSULE | Freq: Four times a day (QID) | ORAL | 5 refills | Status: DC

## 2022-10-21 MED ORDER — METHADONE HCL 10 MG PO TABS: ORAL_TABLET | ORAL | 0 refills | Status: DC

## 2022-10-21 NOTE — Progress Notes (Unsigned)
Subjective:    Patient ID: Angela Gilmore, adult    DOB: 11-Oct-1966, 56 y.o.   MRN: [id number]  HPI: Angela Gilmore is a 56 y.o. unknown who returns for follow up appointment for chronic pain and medication refill. She states her  pain is located in her lower back., buttocks and bilateral lower extremities she reports.  She rates her pain 9. Her current exercise regime  performing upper extremities bicycle.  She arrived in wheelchair .  Ms. Pop Morphine equivalent is 141.00 MME.   Angela Gilmore is also prescribed Diazepam  by Angela Gilmore .We have discussed the black box warning of using opioids and benzodiazepines. I highlighted the dangers of using these drugs together and discussed the adverse events including respiratory suppression, overdose, cognitive impairment and importance of compliance with current regimen. We will continue to monitor and adjust as indicated.  Angela Gilmore is being closely monitored . Angela Gilmore verbalizes understanding.    She states at this time she is the caregiver of her grandchildren and using diazepam sparingly, last refill date 07/17/2022  Angela Gilmore in room    Pain Inventory Average Pain 9 Pain Right Now 9 My pain is constant, burning, tingling, and aching  In the last 24 hours, has pain interfered with the following? General activity 10 Relation with others 10 Enjoyment of life 10 What TIME of day is your pain at its worst? morning , daytime, evening, and night Sleep (in general) Fair  Pain is worse with:  . Pain improves with: medication Relief from Meds: 2  Family History  Problem Relation Age of Onset   Heart disease Father    Emphysema Father    Breast cancer Sister    Mental illness Sister    Stroke Brother    Mental illness Brother    Social History   Socioeconomic History   Marital status: Unknown    Spouse name: Not on file   Number of children: Not on file   Years of  education: Not on file   Highest education level: Not on file  Occupational History   Not on file  Tobacco Use   Smoking status: Former   Smokeless tobacco: Never  Vaping Use   Vaping Use: Never used  Substance and Sexual Activity   Alcohol use: Not Currently   Drug use: Never   Sexual activity: Not on file  Other Topics Concern   Not on file  Social History Narrative   Not on file   Social Determinants of Health   Financial Resource Strain: Not on file  Food Insecurity: Not on file  Transportation Needs: Not on file  Physical Activity: Not on file  Stress: Not on file  Social Connections: Not on file   Past Surgical History:  Procedure Laterality Date   ABDOMINAL SURGERY     BREAST LUMPECTOMY     CARPAL TUNNEL RELEASE     HIP ARTHROSCOPY W/ LABRAL REPAIR     KNEE ARTHROPLASTY     PARTIAL HYSTERECTOMY     ROTATOR CUFF REPAIR     SPINAL FIXATION SURGERY     THORACOTOMY     TONSILECTOMY/ADENOIDECTOMY WITH MYRINGOTOMY     TRIGGER FINGER RELEASE     WISDOM TOOTH EXTRACTION     Past Surgical History:  Procedure Laterality Date   ABDOMINAL SURGERY     BREAST LUMPECTOMY     CARPAL TUNNEL RELEASE     HIP ARTHROSCOPY W/ LABRAL REPAIR  KNEE ARTHROPLASTY     PARTIAL HYSTERECTOMY     ROTATOR CUFF REPAIR     SPINAL FIXATION SURGERY     THORACOTOMY     TONSILECTOMY/ADENOIDECTOMY WITH MYRINGOTOMY     TRIGGER FINGER RELEASE     WISDOM TOOTH EXTRACTION     Past Medical History:  Diagnosis Date   Hypoglycemia    Paraplegia (HCC)    BP 94/61   Pulse 71   Ht 5\' 6"  (1.676 m)   SpO2 98%   BMI 31.93 kg/m   Opioid Risk Score:   Fall Risk Score:  `1  Depression screen [facility] 2/9     07/13/2022   10:11 AM 03/16/2022   10:37 AM 12/13/2021   10:26 AM 08/20/2021    9:23 AM 05/14/2021   10:13 AM 01/04/2021    9:53 AM 05/01/2020   10:23 AM  Depression screen [facility] 2/9  Decreased Interest 3 2 1 1 1  0 0  Down, Depressed, Hopeless 3 2 1 1 1  0 0  PHQ - 2 Score 6 4 2 2 2  0 0       Review of Systems  Musculoskeletal:  Positive for back pain and gait problem.  All other systems reviewed and are negative.     Objective:   Physical Exam Vitals and nursing note reviewed.  Constitutional:      Appearance: Normal appearance.  Cardiovascular:     Rate and Rhythm: Normal rate and regular rhythm.     Pulses: Normal pulses.     Heart sounds: Normal heart sounds.  Pulmonary:     Effort: Pulmonary effort is normal.     Breath sounds: Normal breath sounds.  Genitourinary:    Comments: Supra Pubic Catheter: Site with dressing intact: Draining Yellow Urine  Musculoskeletal:     Cervical back: Normal range of motion and neck supple.     Comments: Normal Muscle Bulk and Muscle Testing Reveals:  Upper Extremities: Full ROM and Muscle Strength 5/5  Lower Extremities : Paralysis Arrived in wheelchair     Skin:    General: Skin is warm and dry.  Neurological:     Mental Status: 4/9 Tabb is alert and oriented to person, place, and time.  Psychiatric:        Mood and Affect: Mood normal.        Behavior: Behavior normal.         Assessment & Plan:  Paraplegia: Wheelchair Dependence Continue HEP as tolerated. Continue to Monitor.  Neurogenic Bowel: Continue Bowel Program. Continue to Monitor.  Neurogenic Bladder: Supra Pubic Catheter Draining Yellow Urine.  Spasticity: Continue current medication regimen. Continue to Monitor.  Neuropathic Pain: Continue Current medication regimen. Continue to Monitor.  Chronic Pain Syndrome: Refill: Methadone 10 mg one tablet every 8 hors as needed for pain #90 . Second script sent for the following month to accommodate scheduled appointment. We will continue the opioid monitoring program, this consists of regular clinic visits, examinations, urine drug screen, pill counts as well as use of Controlled Substance Reporting system. A 12 month History has been reviewed on the Controlled  Substance Reporting System on 10/21/2022.   F/U with Dr Angela Jewel in 3 months.

## 2022-10-24 ENCOUNTER — Encounter: Payer: Self-pay | Admitting: Registered Nurse

## 2022-10-28 ENCOUNTER — Ambulatory Visit: Payer: BC Managed Care – PPO | Admitting: Physical Medicine and Rehabilitation

## 2022-12-09 ENCOUNTER — Other Ambulatory Visit: Payer: Self-pay | Admitting: Physical Medicine and Rehabilitation

## 2022-12-09 MED ORDER — METHADONE HCL 10 MG PO TABS: ORAL_TABLET | ORAL | 0 refills | Status: DC

## 2022-12-09 NOTE — Telephone Encounter (Signed)
From: Sheppard Evens Eguia To: Office of Danella Sensing, [place] Sent: 12/01/2022 9:11 PM EST Subject: Medication Renewal Request  Refills have been requested for the following medications:   methadone (DOLOPHINE) 10 MG tablet [Eunice Thomas]  Preferred pharmacy: [id number] [address] Delivery method: [employer]

## 2023-01-02 ENCOUNTER — Other Ambulatory Visit: Payer: Self-pay | Admitting: Physical Medicine and Rehabilitation

## 2023-01-03 MED ORDER — METHADONE HCL 10 MG PO TABS: ORAL_TABLET | ORAL | 0 refills | Status: DC

## 2023-01-19 DIAGNOSIS — Z87891 Personal history of nicotine dependence: Secondary | ICD-10-CM | POA: Insufficient documentation

## 2023-01-23 ENCOUNTER — Encounter
Payer: BC Managed Care – PPO | Attending: Physical Medicine and Rehabilitation | Admitting: Physical Medicine and Rehabilitation

## 2023-01-23 ENCOUNTER — Encounter: Payer: Self-pay | Admitting: Physical Medicine and Rehabilitation

## 2023-01-23 VITALS — BP 103/67 | HR 60 | Ht 66.0 in

## 2023-01-23 DIAGNOSIS — G894 Chronic pain syndrome: Secondary | ICD-10-CM | POA: Insufficient documentation

## 2023-01-23 DIAGNOSIS — G8921 Chronic pain due to trauma: Secondary | ICD-10-CM | POA: Insufficient documentation

## 2023-01-23 DIAGNOSIS — G8220 Paraplegia, unspecified: Secondary | ICD-10-CM | POA: Insufficient documentation

## 2023-01-23 DIAGNOSIS — Z5181 Encounter for therapeutic drug level monitoring: Secondary | ICD-10-CM | POA: Insufficient documentation

## 2023-01-23 DIAGNOSIS — Z79891 Long term (current) use of opiate analgesic: Secondary | ICD-10-CM | POA: Insufficient documentation

## 2023-01-23 MED ORDER — DULOXETINE HCL 60 MG PO CPEP: 120.0000 mg | ORAL_CAPSULE | Freq: Every day | ORAL | 3 refills | Status: DC

## 2023-01-23 MED ORDER — METHADONE HCL 10 MG PO TABS: ORAL_TABLET | ORAL | 0 refills | Status: DC

## 2023-01-23 MED ORDER — DIAZEPAM 5 MG PO TABS: 5.0000 mg | ORAL_TABLET | Freq: Every day | ORAL | 5 refills | Status: DC

## 2023-01-23 NOTE — Patient Instructions (Signed)
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 With increased nerve pain.  Biggest limiter is nerve pain. Also tachycardic; Long COVID- new dx.  Pt is here for f/u on Nerve pain and SCI.   Last MRI 3 years ago- no imaging since then- my concern is that might have a syrinx or something else that's causing her pain to skyrocket like it has- will check MRI of thoracic and lumbar to see if developed a syrinx or something else- a compression fx that  is causing pain.   2. Will increase Cymbalta/Duloxetine to 120 mg nightly- for nerve pain    3. Con't gabapentin 400 mg QID/4x/day-   4. Con't Methadone and Oxycodone prn- needs refill of Methadone- done for pt.   5. Needs refills of Valium 5 mg nightly for spasms- #30- 5 refills  6. Plans on getting ROHO back cushion on w/c.   7. F/U in 53month- wait to change other pain meds til sees if Cymbalta helps.   8. Called Dr NMechele Dawley to look a pain pump- d/w pt and Dr NMechele Dawleyfrom room.

## 2023-01-23 NOTE — Progress Notes (Signed)
Subjective:    Patient ID: Angela Gilmore, female    DOB: 1966/04/16, 57 y.o.   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 With increased nerve pain.  Biggest limiter is nerve pain. Also tachycardic; Long COVID- new dx.  Pt is here for f/u on Nerve pain and SCI.    Last saw Dr Mechele Dawley- was when did trial of SCI stimulator and didn't work.   Pain has moved down the backs of her legs.  No enjoyment whatsoever.  Buttocks hurt so bad- and "supposed to not feel it".  Spasticity- some days good and some days bad- the more she hurts, the worse spasticity is.  Stays in pressure relief all day long and cannot stand being in w/c   Kids are likely to go home soon- all 4 kids.   Constipation- neurogenic bowel On oral meds- Senokot- 4 tabs/day-  Nothing has been changed.  Bowel program- suppository dulcolax and dig stim.  As long as drinks water, then does well.   Last MRI was 3 years ago.   Used to be able to get comfortable in bed, but cannot get comfortable in bed now.  Only takes Valium at night anymore.  Only takes Oxycodone- every blue moon because the kids.   Still taking the Methadone 15 mg BID Gabapentin- 400 mg 4x/day Duloxetine 60 mg daily Valium 5 mg QHS Baclofen 20 mg QID Oxycodone 5 mg once in a blue moon   When went up on gabapentin didn't notice a difference Haven't ever gone up on Duloxetine.   Pain Inventory Average Pain 8 Pain Right Now 6 My pain is constant, burning, and tingling  LOCATION OF PAIN  back, buttocks, hip, thigh  BOWEL Number of stools per week: 4 Oral laxative use Yes  Type of laxative Doculax Enema or suppository use Yes    BLADDER Suprapubic   Mobility use a wheelchair needs help with transfers Do you have any goals in this area?  yes  Function disabled: date disabled 2020 I need  assistance with the following:  dressing, bathing, toileting, meal prep, household duties, and shopping Do you have any goals in this area?  yes  Neuro/Psych bladder control problems bowel control problems weakness numbness tremor tingling trouble walking spasms dizziness depression anxiety  Prior Studies Any changes since last visit?  yes, [facility]  Physicians involved in your care Any changes since last visit?  no   Family History  Problem Relation Age of Onset   Heart disease Father    Emphysema Father    Breast cancer Sister    Mental illness Sister    Stroke Brother    Mental illness Brother    Social History   Socioeconomic History   Marital status: Married    Spouse name: Not on file   Number of children: Not on file   Years of education: Not on file   Highest education level: Not on file  Occupational History   Not on file  Tobacco Use   Smoking status: Former   Smokeless tobacco: Never  Vaping Use   Vaping Use: Never used  Substance and Sexual Activity   Alcohol use: Not Currently   Drug use: Never   Sexual activity: Not on file  Other Topics Concern   Not on file  Social History Narrative   Not  on file   Social Determinants of Health   Financial Resource Strain: Not on file  Food Insecurity: Not on file  Transportation Needs: Not on file  Physical Activity: Not on file  Stress: Not on file  Social Connections: Not on file   Past Surgical History:  Procedure Laterality Date   ABDOMINAL SURGERY     BREAST LUMPECTOMY     CARPAL TUNNEL RELEASE     HIP ARTHROSCOPY W/ LABRAL REPAIR     KNEE ARTHROPLASTY     PARTIAL HYSTERECTOMY     ROTATOR [place]     TONSILECTOMY/ADENOIDECTOMY WITH MYRINGOTOMY     TRIGGER FINGER RELEASE     WISDOM TOOTH EXTRACTION     Past Medical History:  Diagnosis Date   Hypoglycemia    Paraplegia ([place])    There were no vitals taken for this  visit.  Opioid Risk Score:   Fall Risk Score:  `1  Depression screen [facility] 2/9     07/13/2022   10:11 AM 03/16/2022   10:37 AM 12/13/2021   10:26 AM 08/20/2021    9:23 AM 05/14/2021   10:13 AM 01/04/2021    9:53 AM 05/01/2020   10:23 AM  Depression screen PH[facility] 2/9  Decreased Interest '3 2 1 1 1 '$ 0 0  Down, Depressed, Hopeless '3 2 1 1 1 '$ 0 0  PHQ - 2 Score '6 4 2 2 2 '$ 0 0    Review of Systems  Gastrointestinal:        Bowel regimen   Genitourinary:        Suprapubic   Musculoskeletal:  Positive for gait problem.  Neurological:  Positive for tremors, weakness and numbness.  Psychiatric/Behavioral:         Anxiety, depression  All other systems reviewed and are negative.      Objective:   Physical Exam  Awake,a lert, appropriate, up in power w/c; accompanied by husband, NAD  Spasticity MAS of 1+ to 2 in LE's- today is a "good day".  MS; 0/5 in LE's.  Staying in pressure relief most of appointment.        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 With increased nerve pain.  Biggest limiter is nerve pain. Also tachycardic; Long COVID- new dx.  Pt is here for f/u on Nerve pain and SCI.   Last MRI 3 years ago- no imaging since then- my concern is that might have a syrinx or something else that's causing her pain to skyrocket like it has- will check MRI of thoracic and lumbar to see if developed a syrinx or something else- a compression fx that  is causing pain.   2. Will increase Cymbalta/Duloxetine to 120 mg nightly- for nerve pain    3. Con't gabapentin 400 mg QID/4x/day-   4. Con't Methadone and Oxycodone prn- needs refill of Methadone- done for pt.   5. Needs refills of Valium 5 mg nightly for spasms- #30- 5 refills  6. Plans on getting ROHO back cushion on w/c.   7. F/U in 88month- wait to change other pain meds til sees if Cymbalta helps.    8. Called Dr NMechele Dawley to look a pain pump- d/w pt and Dr NMechele Dawleyfrom room.   I spent a total of 32  minutes on total care today- >50% coordination of care- due to discussion of options for nerve pain; as well as control overall pain- getting MRI to make sure no other cause.

## 2023-01-27 LAB — DRUG TOX MONITOR 1 W/CONF, ORAL FLD
Alprazolam: NEGATIVE ng/mL (ref <0.50)
Amphetamines: NEGATIVE ng/mL (ref <10)
Barbiturates: NEGATIVE ng/mL (ref <10)
Benzodiazepines: POSITIVE ng/mL — AB (ref <0.50)
Buprenorphine: NEGATIVE ng/mL (ref <0.10)
Chlordiazepoxide: NEGATIVE ng/mL (ref <0.50)
Clonazepam: NEGATIVE ng/mL (ref <0.50)
Cocaine: NEGATIVE ng/mL (ref <5.0)
Codeine: NEGATIVE ng/mL (ref <2.5)
Diazepam: 2.75 ng/mL — ABNORMAL HIGH (ref <0.50)
Dihydrocodeine: NEGATIVE ng/mL (ref <2.5)
EDDP: NEGATIVE ng/mL (ref <5.0)
Fentanyl: NEGATIVE ng/mL (ref <0.10)
Flunitrazepam: NEGATIVE ng/mL (ref <0.50)
Flurazepam: NEGATIVE ng/mL (ref <0.50)
Heroin Metabolite: NEGATIVE ng/mL (ref <1.0)
Hydrocodone: NEGATIVE ng/mL (ref <2.5)
Hydromorphone: NEGATIVE ng/mL (ref <2.5)
Lorazepam: NEGATIVE ng/mL (ref <0.50)
MARIJUANA: NEGATIVE ng/mL (ref <2.5)
MDMA: NEGATIVE ng/mL (ref <10)
Meprobamate: NEGATIVE ng/mL (ref <2.5)
Methadone: 188.1 ng/mL — ABNORMAL HIGH (ref <5.0)
Methadone: POSITIVE ng/mL — AB (ref <5.0)
Midazolam: NEGATIVE ng/mL (ref <0.50)
Morphine: NEGATIVE ng/mL (ref <2.5)
Nicotine Metabolite: NEGATIVE ng/mL (ref <5.0)
Nordiazepam: 8.04 ng/mL — ABNORMAL HIGH (ref <0.50)
Norhydrocodone: NEGATIVE ng/mL (ref <2.5)
Noroxycodone: 8 ng/mL — ABNORMAL HIGH (ref <2.5)
Opiates: POSITIVE ng/mL — AB (ref <2.5)
Oxazepam: NEGATIVE ng/mL (ref <0.50)
Oxycodone: 21.8 ng/mL — ABNORMAL HIGH (ref <2.5)
Oxymorphone: NEGATIVE ng/mL (ref <2.5)
Phencyclidine: NEGATIVE ng/mL (ref <10)
Tapentadol: NEGATIVE ng/mL (ref <5.0)
Temazepam: 0.64 ng/mL — ABNORMAL HIGH (ref <0.50)
Tramadol: NEGATIVE ng/mL (ref <5.0)
Triazolam: NEGATIVE ng/mL (ref <0.50)
Zolpidem: NEGATIVE ng/mL (ref <5.0)

## 2023-01-27 LAB — DRUG TOX ALC METAB W/CON, ORAL FLD: Alcohol Metabolite: NEGATIVE ng/mL (ref <25)

## 2023-02-02 ENCOUNTER — Telehealth: Payer: Self-pay | Admitting: *Deleted

## 2023-02-02 NOTE — Telephone Encounter (Signed)
Oral swab drug screen was consistent for prescribed medications.  ?

## 2023-02-06 ENCOUNTER — Other Ambulatory Visit: Payer: Self-pay | Admitting: Physical Medicine and Rehabilitation

## 2023-02-07 MED ORDER — METHADONE HCL 10 MG PO TABS: ORAL_TABLET | ORAL | 0 refills | Status: DC

## 2023-03-10 ENCOUNTER — Other Ambulatory Visit: Payer: Self-pay | Admitting: Physical Medicine and Rehabilitation

## 2023-03-11 ENCOUNTER — Ambulatory Visit (HOSPITAL_COMMUNITY)
Admission: RE | Admit: 2023-03-11 | Discharge: 2023-03-11 | Disposition: A | Discharge status: Home / Self Care | Payer: BC Managed Care – PPO | Source: Ambulatory Visit | Attending: Physical Medicine and Rehabilitation | Admitting: Physical Medicine and Rehabilitation

## 2023-03-11 DIAGNOSIS — G894 Chronic pain syndrome: Secondary | ICD-10-CM | POA: Insufficient documentation

## 2023-03-11 DIAGNOSIS — G8220 Paraplegia, unspecified: Secondary | ICD-10-CM | POA: Diagnosis present

## 2023-03-13 MED ORDER — METHADONE HCL 10 MG PO TABS: ORAL_TABLET | ORAL | 0 refills | Status: DC

## 2023-03-16 ENCOUNTER — Telehealth: Payer: Self-pay | Admitting: Physical Medicine and Rehabilitation

## 2023-03-16 MED ORDER — PREDNISONE 20 MG PO TABS: 40.0000 mg | ORAL_TABLET | Freq: Every day | ORAL | 0 refills | Status: DC

## 2023-03-16 NOTE — Telephone Encounter (Signed)
Nerve pain just nothing gets better.   Still in butt and bottoms of thighs.   Due to edematous changes on thoracic spine- will try some steroids for 1 week-    Increase in duloxetine did nothing- if anything, it's getting a little worse. Just stops her functionally.    Will try Prednisone 40 mg daily x 5 days, and see if that will help.  D/w pt-  Will try again at next appointment to get her Nucynta again.

## 2023-03-27 ENCOUNTER — Telehealth: Payer: Self-pay

## 2023-03-27 NOTE — Telephone Encounter (Signed)
Nupur next appointment is in June.   Patient stated she is having uncontrolled nerve pain. She wanted to know if you will send the compound medication? The one you prescribed in the past. And a steroid to her local pharmacy. Her current medications are not helping with pain relief.   Call back phone [phone].

## 2023-04-26 ENCOUNTER — Telehealth: Payer: Self-pay | Admitting: *Deleted

## 2023-04-26 ENCOUNTER — Encounter
Payer: BC Managed Care – PPO | Attending: Physical Medicine and Rehabilitation | Admitting: Physical Medicine and Rehabilitation

## 2023-04-26 ENCOUNTER — Encounter: Payer: Self-pay | Admitting: Physical Medicine and Rehabilitation

## 2023-04-26 VITALS — BP 121/80 | HR 67 | Ht 66.0 in | Wt 203.2 lb

## 2023-04-26 DIAGNOSIS — M792 Neuralgia and neuritis, unspecified: Secondary | ICD-10-CM | POA: Diagnosis present

## 2023-04-26 DIAGNOSIS — G8921 Chronic pain due to trauma: Secondary | ICD-10-CM | POA: Diagnosis present

## 2023-04-26 DIAGNOSIS — Z993 Dependence on wheelchair: Secondary | ICD-10-CM | POA: Diagnosis present

## 2023-04-26 DIAGNOSIS — G8220 Paraplegia, unspecified: Secondary | ICD-10-CM | POA: Diagnosis present

## 2023-04-26 MED ORDER — TAPENTADOL HCL ER 100 MG PO TB12: 100.0000 mg | ORAL_TABLET | Freq: Two times a day (BID) | ORAL | 0 refills | Status: DC

## 2023-04-26 MED ORDER — METHADONE HCL 10 MG PO TABS: 15.0000 mg | ORAL_TABLET | Freq: Two times a day (BID) | ORAL | 0 refills | Status: DC
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
## 2023-04-26 NOTE — Patient Instructions (Addendum)
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 With increased nerve pain.  Biggest limiter is nerve pain. Also tachycardic; Long COVID- new dx.  Pt is here for f/u on Nerve pain and SCI.  Doesn't have syrinx  Driver's evaluation- person in [place] area-   2. Nucynta ER- 100 mg 2x/day- Has tried Butran's patch and Belbuca- also failed Gabapentin, Cymbalta, and Methadone-  Has also tried Oxycodone, and other Morphine and Norco- but these don't treat nerve pain, so also not effective.  Has literally tried EVERYTHING    3. Would benefit from being able to to do outpt PT but didn't have a ride. - per husband, cannot get H/H again -still cannot get in/out of bed on own- but cannot get INTO bed- does supervision.   4. Uses dog collars for leg loops- which is reasonable.   5.Will order H/H for teaching transfers and ADLs- since cannot transfer self and cannot get to outpt PT/OT.   6.  If cannot get Nucynta- then can try to get pt off Methadone and and then retry Low dose Naltrexone. But has to be off opioids for 7+ days to try.  If we wean her off, will go down SLOWLY.   7. If cannot get Nucynta, will d/w pt about Vassie Moselle- but usually for DM neuropathy, so don't know if can get insurance,wise.    8. Discussed options for nerve pain- at length- will retry other meds.    9. Went over pain and pain pathways- and why she's hurts so much.   10. Don't change methadone dose until knows if will get Nucynta or if just weaning down to try Naltrexone again.   11. No difference with ketamine x1.   12.  OT that does Driver evaluations and hand controls- I think she's in [place]? Don't Quote me?   15. Has alternating pressure mattress- move every 15 minutes- do in middle of firmness and can titrate based on feeling/more/less pain.   13. Refill Methadone 15 mg BID- #90-   14. F/U in 3 months-  double appt- SCI

## 2023-04-26 NOTE — Telephone Encounter (Signed)
Nucynta too costly with discount card and not covered by patient insurance. Alternative medication requested by pharmacy

## 2023-04-27 ENCOUNTER — Telehealth: Payer: Self-pay

## 2023-04-27 NOTE — Telephone Encounter (Signed)
Called [facility] pharmacy to see if medication needed PA. She says that medication is not on patient's formulary and will cost over $1300.

## 2023-04-27 NOTE — Telephone Encounter (Signed)
Booke with [facility] called: At [facility] request [facility] will start next week.

## 2023-04-28 NOTE — Telephone Encounter (Signed)
Patient aware no changes until after Dr. Berline Chough returns.

## 2023-05-02 ENCOUNTER — Telehealth: Payer: Self-pay | Admitting: *Deleted

## 2023-05-02 NOTE — Telephone Encounter (Signed)
Thomas requesting verbal PT orders. 1Wx1 2Wx3 1 Wx4 ; Verbal given

## 2023-05-03 NOTE — Telephone Encounter (Signed)
Can you help me with this?

## 2023-05-05 ENCOUNTER — Telehealth: Payer: Self-pay

## 2023-05-05 NOTE — Telephone Encounter (Signed)
Lelon Mast, OT from [facility] called requesting verbal order for OT and PT 2wk2, 1wk6.

## 2023-05-05 NOTE — Telephone Encounter (Signed)
Verbal orders approved and given

## 2023-05-10 ENCOUNTER — Telehealth: Payer: Self-pay | Admitting: *Deleted

## 2023-05-10 NOTE — Telephone Encounter (Signed)
PA Methadone sent vis covermymeds key:[id number]

## 2023-05-11 NOTE — Telephone Encounter (Signed)
Called Joselyn Glassman rep for [employer] and left detailed message re: any assistance for patient's (Nucynta).

## 2023-05-15 ENCOUNTER — Telehealth: Payer: Self-pay | Admitting: *Deleted

## 2023-05-15 NOTE — Telephone Encounter (Signed)
Suggested to pt to reduce Methadone by 5 mg every 1-2 weeks- no faster- went over risks of side effects- faster heart rate, diarrhea, aching- are main side effects from opiate withdrawal. So that's why I want to go slow.

## 2023-05-15 NOTE — Telephone Encounter (Signed)
Patient would like to know how to proceed with tapering down on Methadone? Per last visit if Nucynta was denied then taper process would began.   I did leave a vm for rep Joselyn Glassman and he has not returned call.

## 2023-05-19 ENCOUNTER — Telehealth: Payer: Self-pay | Admitting: *Deleted

## 2023-05-19 NOTE — Telephone Encounter (Signed)
Spoke with Joselyn Glassman rep with [employer] will fill out form from website and fax back.

## 2023-05-19 NOTE — Telephone Encounter (Signed)
LVM to call office back re: any patient assistance programs.

## 2023-05-24 ENCOUNTER — Telehealth: Payer: Self-pay | Admitting: *Deleted

## 2023-05-24 NOTE — Telephone Encounter (Signed)
FYI patient requested to cancel Angela Gilmore visit today due to virus GI bug.

## 2023-05-26 NOTE — Telephone Encounter (Signed)
I understand, but I don't cancel those appointments- she needs to call  H/H agency, not me- thanks- ML

## 2023-06-01 ENCOUNTER — Telehealth: Payer: Self-pay | Admitting: *Deleted

## 2023-06-01 ENCOUNTER — Telehealth: Payer: Self-pay

## 2023-06-01 MED ORDER — OXYCODONE HCL 5 MG PO TABS: 5.0000 mg | ORAL_TABLET | Freq: Four times a day (QID) | ORAL | 0 refills | Status: DC | PRN

## 2023-06-01 NOTE — Telephone Encounter (Signed)
Prior Auth (EOC) ID: [id number] Drug/Service Name: OXYCODONE HCL (IR) 5 MG TABLET Patient: Angela Gilmore Date Requested: 06/01/2023 4:13:01 PM   MemberID: [id number] DOB: [birth year]

## 2023-06-01 NOTE — Telephone Encounter (Signed)
Refill request for Oxycodone, hadn't been on in a year but coming off of Methadone

## 2023-06-05 ENCOUNTER — Telehealth: Payer: Self-pay | Admitting: *Deleted

## 2023-06-06 NOTE — Telephone Encounter (Signed)
Oxycodone 5 MG approved 06/02/2023-12/03/2023.

## 2023-06-06 NOTE — Telephone Encounter (Signed)
PA approved 06/02/23-12/03/23. Oxycodone 5 mg

## 2023-06-23 ENCOUNTER — Telehealth: Payer: Self-pay

## 2023-06-23 MED ORDER — METHADONE HCL 10 MG PO TABS: 15.0000 mg | ORAL_TABLET | Freq: Two times a day (BID) | ORAL | 0 refills | Status: DC

## 2023-06-23 NOTE — Telephone Encounter (Signed)
Patient called stating she is weaning off Methadone and don't have enough to wean. She states she had to hold at 15 mg because she was having diarrhea. She states she has three more days worth for 15 mg and then she will go down to 10 mg for 2 weeks, then 5 mg for 2 weeks.

## 2023-06-28 ENCOUNTER — Encounter: Payer: Self-pay | Admitting: Physical Medicine and Rehabilitation

## 2023-06-28 DIAGNOSIS — M792 Neuralgia and neuritis, unspecified: Secondary | ICD-10-CM

## 2023-06-28 DIAGNOSIS — G8220 Paraplegia, unspecified: Secondary | ICD-10-CM

## 2023-07-03 ENCOUNTER — Telehealth: Payer: Self-pay | Admitting: *Deleted

## 2023-07-03 NOTE — Telephone Encounter (Signed)
Samantha calling from [facility] requesting verbal orders OT 1x week 9 weeks.  LVM on secure line verbal given.

## 2023-07-23 ENCOUNTER — Other Ambulatory Visit: Payer: Self-pay | Admitting: Physical Medicine and Rehabilitation

## 2023-08-02 ENCOUNTER — Encounter
Payer: BC Managed Care – PPO | Attending: Physical Medicine and Rehabilitation | Admitting: Physical Medicine and Rehabilitation

## 2023-08-02 ENCOUNTER — Other Ambulatory Visit: Payer: Self-pay | Admitting: Physical Medicine and Rehabilitation

## 2023-08-02 ENCOUNTER — Encounter: Payer: Self-pay | Admitting: Physical Medicine and Rehabilitation

## 2023-08-02 VITALS — BP 110/73 | HR 62 | Ht 66.0 in | Wt 194.4 lb

## 2023-08-02 DIAGNOSIS — G8221 Paraplegia, complete: Secondary | ICD-10-CM | POA: Diagnosis not present

## 2023-08-02 DIAGNOSIS — M792 Neuralgia and neuritis, unspecified: Secondary | ICD-10-CM | POA: Insufficient documentation

## 2023-08-02 DIAGNOSIS — R252 Cramp and spasm: Secondary | ICD-10-CM | POA: Insufficient documentation

## 2023-08-02 DIAGNOSIS — Z993 Dependence on wheelchair: Secondary | ICD-10-CM | POA: Insufficient documentation

## 2023-08-02 DIAGNOSIS — G904 Autonomic dysreflexia: Secondary | ICD-10-CM | POA: Diagnosis not present

## 2023-08-02 MED ORDER — METHADONE HCL 10 MG PO TABS: 10.0000 mg | ORAL_TABLET | Freq: Two times a day (BID) | ORAL | 0 refills | Status: DC

## 2023-08-02 MED ORDER — GABAPENTIN 400 MG PO CAPS: 400.0000 mg | ORAL_CAPSULE | Freq: Four times a day (QID) | ORAL | 5 refills | Status: DC

## 2023-08-02 MED ORDER — DIAZEPAM 5 MG PO TABS: 5.0000 mg | ORAL_TABLET | Freq: Two times a day (BID) | ORAL | 1 refills | Status: DC
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
## 2023-08-07 ENCOUNTER — Encounter: Payer: Self-pay | Admitting: Physical Medicine and Rehabilitation

## 2023-08-08 ENCOUNTER — Encounter: Payer: Self-pay | Admitting: Pain Medicine

## 2023-08-08 ENCOUNTER — Ambulatory Visit: Payer: BC Managed Care – PPO | Admitting: Pain Medicine

## 2023-08-08 DIAGNOSIS — E6609 Other obesity due to excess calories: Secondary | ICD-10-CM | POA: Insufficient documentation

## 2023-08-08 DIAGNOSIS — Z6834 Body mass index (BMI) 34.0-34.9, adult: Secondary | ICD-10-CM | POA: Insufficient documentation

## 2023-08-21 ENCOUNTER — Encounter: Payer: Self-pay | Admitting: Physical Medicine and Rehabilitation

## 2023-08-23 ENCOUNTER — Ambulatory Visit: Payer: BC Managed Care – PPO | Admitting: Pain Medicine

## 2023-08-28 NOTE — Progress Notes (Unsigned)
Patient: Angela Gilmore  Service Category: E/M  Provider: Oswaldo Done, MD  DOB: 21-Mar-1966  DOS: 08/30/2023  Referring Provider: Minerva Ends  MRN: [id number]  Setting: Ambulatory outpatient  PCP: Kennis Carina, PA-C  Type: New Patient  Specialty: Interventional Pain Management    Location: Office  Delivery: Face-to-face     Primary Reason(s) for Visit: Encounter for initial evaluation of one or more chronic problems (new to examiner) potentially causing chronic pain, and posing a threat to normal musculoskeletal function. (Level of risk: High) CC: No chief complaint on file.  HPI  Angela Gilmore is a 57 y.o. year old, female patient, who comes for the first time to our practice referred by Kennis Carina, PA-C for our initial evaluation of her chronic pain. 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 [facility]); Spasticity; Neurogenic bowel; Neurogenic bladder; Suprapubic catheter (HCC); Myofascial pain dysfunction syndrome; Syrinx (HCC); Hair abnormality; Chronic pain due to trauma; Nerve pain; Long COVID; Wheelchair dependence; Autonomic dysreflexia; Pes anserinus bursitis; Arthrodesis status; Status post left partial knee replacement; Body mass index (BMI) 34.0-34.9, adult; Closed fracture of fourth thoracic vertebra with routine healing; Debility; Former smoker; Fracture of multiple ribs of left side; Fracture of third thoracic vertebra (HCC); Left hip pain; Left knee pain; Obesity due to excess calories; Osteochondritis dissecans; Primary osteoarthritis of both hands; Right shoulder pain; T10 spinal cord injury (HCC); Thoracic radiculopathy; Trauma; Unilateral primary osteoarthritis, left knee; Urinary retention; Varus deformity of knee; Knee joint replacement status, unspecified laterality; Aftercare following left knee joint replacement surgery; and Arthritis, senescent on their problem list. Today she comes in for evaluation of her No chief complaint on file.  Pain  Assessment: Location:     Radiating:   Onset:   Duration:   Quality:   Severity:  /10 (subjective, self-reported pain score)  Effect on ADL:   Timing:   Modifying factors:   BP:    HR:    Onset and Duration: {Hx; Onset and Duration:210120511} Cause of pain: {Hx; Cause:210120521} Severity: {Pain Severity:210120502} Timing: {Symptoms; Timing:210120501} Aggravating Factors: {Causes; Aggravating pain factors:210120507} Alleviating Factors: {Causes; Alleviating Factors:210120500} Associated Problems: {Hx; Associated problems:210120515} Quality of Pain: {Hx; Symptom quality or Descriptor:210120531} Previous Examinations or Tests: {Hx; Previous examinations or test:210120529} Previous Treatments: {Hx; Previous Treatment:210120503}  Angela Gilmore is being evaluated for possible interventional pain management therapies for the treatment of her chronic pain.   ***  Angela Gilmore has been informed that this initial visit was an evaluation only.  On the follow up appointment I will go over the results, including ordered tests and available interventional therapies. At that time she will have the opportunity to decide whether to proceed with offered therapies or not. In the event that Ms. Conrey prefers avoiding interventional options, this will conclude our involvement in the case.  Medication management recommendations may be provided upon request.  Patient informed that diagnostic tests may be ordered to assist in identifying underlying causes, narrow the list of differential diagnoses and aid in determining candidacy for (or contraindications to) planned therapeutic interventions.  Historic Controlled Substance Pharmacotherapy Review  PMP and historical list of controlled substances: ***  Most recently prescribed opioid analgesics:   *** MME/day: *** mg/day  Historical Monitoring: The patient  reports no history of drug use. List of prior UDS Testing: No results found for: "MDMA",  "COCAINSCRNUR", "PCPSCRNUR", "PCPQUANT", "CANNABQUANT", "THCU", "ETH", "CBDTHCR", "D8THCCBX", "D9THCCBX" Historical Background Evaluation: [place] PMP: PDMP reviewed during this encounter. Review of the past 21-months conducted.  PMP NARX Score Report:  Narcotic: *** Sedative: *** Stimulant: *** [place] Department of public safety, offender search: Engineer, mining Information) Non-contributory Risk Assessment Profile: Aberrant behavior: None observed or detected today Risk factors for fatal opioid overdose: None identified today PMP NARX Overdose Risk Score: *** Fatal overdose hazard ratio (HR): Calculation deferred Non-fatal overdose hazard ratio (HR): Calculation deferred Risk of opioid abuse or dependence: 0.7-3.0% with doses <= 36 MME/day and 6.1-26% with doses >= 120 MME/day. Substance use disorder (SUD) risk level: See below Personal History of Substance Abuse (SUD-Substance use disorder):  Alcohol:    Illegal Drugs:    Rx Drugs:    ORT Risk Level calculation:    ORT Scoring interpretation table:  Score <3 = Low Risk for SUD  Score between 4-7 = Moderate Risk for SUD  Score >8 = High Risk for Opioid Abuse   PHQ-2 Depression Scale:  Total score:    PHQ-2 Scoring interpretation table: (Score and probability of major depressive disorder)  Score 0 = No depression  Score 1 = 15.4% Probability  Score 2 = 21.1% Probability  Score 3 = 38.4% Probability  Score 4 = 45.5% Probability  Score 5 = 56.4% Probability  Score 6 = 78.6% Probability   PHQ-9 Depression Scale:  Total score:    PHQ-9 Scoring interpretation table:  Score 0-4 = No depression  Score 5-9 = Mild depression  Score 10-14 = Moderate depression  Score 15-19 = Moderately severe depression  Score 20-27 = Severe depression (2.4 times higher risk of SUD and 2.89 times higher risk of overuse)   Pharmacologic Plan: As per protocol, I have not taken over any controlled substance management, pending the results of ordered  tests and/or consults.            Initial impression: Pending review of available data and ordered tests.  Meds   Current Outpatient Medications:    acetaminophen (TYLENOL) 325 MG tablet, Take by mouth., Disp: , Rfl:    apixaban (ELIQUIS) 5 MG TABS tablet, 1 tablet Orally Twice a day for 30 days, Disp: , Rfl:    baclofen (LIORESAL) 20 MG tablet, TAKE 1 TABLET BY MOUTH 4 TIMES DAILY., Disp: 120 tablet, Rfl: 5   diazepam (VALIUM) 5 MG tablet, Take 1 tablet (5 mg total) by mouth in the morning and at bedtime. For spasticity with baclofen, Disp: 180 tablet, Rfl: 1   doxycycline (VIBRA-TABS) 100 MG tablet, , Disp: , Rfl:    DULoxetine (CYMBALTA) 60 MG capsule, TAKE 1 CAPSULE BY MOUTH EVERY DAY, Disp: 30 capsule, Rfl: 11   gabapentin (NEURONTIN) 400 MG capsule, Take 1 capsule (400 mg total) by mouth 4 (four) times daily., Disp: 120 capsule, Rfl: 5   Lorcaserin HCl 10 MG TABS, Take one daily for a week , then take twice daily, Disp: , Rfl:    methadone (DOLOPHINE) 10 MG tablet, Take 1 tablet (10 mg total) by mouth every 12 (twelve) hours. For nerve pain, Disp: 60 tablet, Rfl: 0   metoprolol succinate (TOPROL-XL) 25 MG 24 hr tablet, 1 tablet Orally Once a day, Disp: , Rfl:    midodrine (PROAMATINE) 10 MG tablet, 1 tablet Orally three times a day, Disp: , Rfl:    minocycline (MINOCIN) 100 MG capsule, Take by mouth daily., Disp: , Rfl:    naloxone (NARCAN) nasal spray 4 mg/0.1 mL, Take if accidental opioid overdose-, Disp: 1 each, Rfl: 5   Oxcarbazepine (TRILEPTAL) 300 MG tablet, Take 300 mg by mouth 2 (two) times  daily., Disp: , Rfl:    oxyCODONE (ROXICODONE) 5 MG immediate release tablet, Take 1 tablet (5 mg total) by mouth every 6 (six) hours as needed for breakthrough pain., Disp: 120 tablet, Rfl: 0   pantoprazole (PROTONIX) 40 MG tablet, 1 tablet Orally Once a day, Disp: , Rfl:    Polyethylene Glycol 3350 (MIRALAX PO), MiraLax 17 gram, Disp: , Rfl:    senna-docusate (SENOKOT-S) 8.6-50 MG tablet,  Take by mouth., Disp: , Rfl:   Imaging Review  Cervical Imaging: Cervical MR wo contrast: No results found for this or any previous visit.  Cervical MR wo contrast: No valid procedures specified. Cervical MR w/wo contrast: Results for orders placed during the hospital encounter of 12/24/19  MR CERVICAL SPINE W WO CONTRAST  Narrative CLINICAL DATA:  History of prior thoracic spine surgery for spinal cord injury. Increasing numbness across the spine and pain both above and below the level of injury. Question syrinx cord compression.  EXAM: MRI CERVICAL SPINE WITHOUT AND WITH CONTRAST  TECHNIQUE: Multiplanar and multiecho pulse sequences of the cervical spine, to include the craniocervical junction and cervicothoracic junction, were obtained without and with intravenous contrast.  CONTRAST:  7.5 mL GADAVIST IV  COMPARISON:  None.  FINDINGS: Alignment: Straightening of lordosis and trace anterolisthesis C3 on C4.  Vertebrae: No fracture, evidence of discitis, or bone lesion.  Cord: Normal signal throughout. Negative for syrinx or pathologic enhancement after contrast administration.  Posterior Fossa, vertebral arteries, paraspinal tissues: Negative.  Disc levels:  C2-3: Mild facet arthropathy. Otherwise negative.  C3-4: Mild posterior bony ridging and facet degenerative change without stenosis.  C4-5: Shallow down turning central protrusion without stenosis.  C5-6: Negative.  C6-7: Shallow broad-based protrusion without stenosis.  C7-T1: Negative.  IMPRESSION: 1. Negative for syrinx or other finding to explain the patient's symptoms. 2. 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   Electronically Signed By: Drusilla Kanner M.D. On: 12/25/2019 10:08  Cervical MR w contrast: No results found for this or any previous visit.  Cervical CT wo contrast: No results found for this or any previous visit.  Cervical CT w/wo contrast: No results found for  this or any previous visit.  Cervical CT w/wo contrast: No results found for this or any previous visit.  Cervical CT w contrast: No results found for this or any previous visit.  Cervical CT outside: No results found for this or any previous visit.  Cervical DG 1 view: No results found for this or any previous visit.  Cervical DG 2-3 views: No results found for this or any previous visit.  Cervical DG F/E views: No results found for this or any previous visit.  Cervical DG 2-3 clearing views: No results found for this or any previous visit.  Cervical DG Bending/F/E views: No results found for this or any previous visit.  Cervical DG complete: No results found for this or any previous visit.  Cervical DG Myelogram views: No results found for this or any previous visit.  Cervical DG Myelogram views: No results found for this or any previous visit.  Cervical Discogram views: No results found for this or any previous visit.   Shoulder Imaging: Shoulder-R MR w contrast: No results found for this or any previous visit.  Shoulder-L MR w contrast: No results found for this or any previous visit.  Shoulder-R MR w/wo contrast: No results found for this or any previous visit.  Shoulder-L MR w/wo contrast: No results found for this or any previous visit.  Shoulder-R MR wo contrast: No results found for this or any previous visit.  Shoulder-L MR wo contrast: No results found for this or any previous visit.  Shoulder-R CT w contrast: No results found for this or any previous visit.  Shoulder-L CT w contrast: No results found for this or any previous visit.  Shoulder-R CT w/wo contrast: No results found for this or any previous visit.  Shoulder-L CT w/wo contrast: No results found for this or any previous visit.  Shoulder-R CT wo contrast: No results found for this or any previous visit.  Shoulder-L CT wo contrast: No results found for this or any previous visit.  Shoulder-R DG  Arthrogram: No results found for this or any previous visit.  Shoulder-L DG Arthrogram: No results found for this or any previous visit.  Shoulder-R DG 1 view: No results found for this or any previous visit.  Shoulder-L DG 1 view: No results found for this or any previous visit.  Shoulder-R DG: No results found for this or any previous visit.  Shoulder-L DG: No results found for this or any previous visit.   Thoracic Imaging: Thoracic MR wo contrast: Results for orders placed during the hospital encounter of 03/11/23  MR THORACIC SPINE WO CONTRAST  Narrative CLINICAL DATA:  Spinal cord injury.  Paraplegia.  Worsening pain.  EXAM: MRI THORACIC SPINE WITHOUT CONTRAST  TECHNIQUE: Multiplanar, multisequence MR imaging of the thoracic spine was performed. No intravenous contrast was administered.  COMPARISON:  12/24/2019  FINDINGS: Alignment:  No malalignment presently.  Vertebrae: Previous posterior fusion from C7 through T6. Development of some endplate edematous change at T6-7, T7-8 and T8-9, which could relate to regional back pain.  Cord: As seen previously, there is pronounced cord atrophy in the lower cervical and upper thoracic region. Sequela of previous cord disruption in the T2 through T4 region. Volume loss of the cord distal to that. The appearance is unchanged since 2021. No evidence of correctable complicating feature. No compressive narrowing of the canal.  Paraspinal and other soft tissues: Negative allowing for artifact from the fusion hardware.  Disc levels:  No new or correctable disc level pathology.  IMPRESSION: 1. Pronounced cord atrophy in the lower cervical and upper thoracic region. Sequela of previous cord disruption in the T2 through T4 region. No evidence of correctable complicating feature. No change in these findings since 2021. 2. Previous posterior fusion from C7 through T6. 3. Development of some endplate edematous change at T6-7,  T7-8 and T8-9, which could relate to regional back pain.   Electronically Signed By: Paulina Fusi M.D. On: 03/15/2023 16:01  Thoracic MR wo contrast: No valid procedures specified. Thoracic MR w/wo contrast: Results for orders placed during the hospital encounter of 12/24/19  MR THORACIC SPINE W WO CONTRAST  Narrative CLINICAL DATA:  History of prior thoracic spine surgery for spinal cord injury. Increasing numbness across the spine and pain both above and below the level of injury. Question syrinx cord compression.  EXAM: MRI THORACIC WITHOUT AND WITH CONTRAST  TECHNIQUE: Multiplanar and multiecho pulse sequences of the thoracic spine were obtained without and with intravenous contrast.  CONTRAST:  7.5 mL GADAVIST IV SOLN  COMPARISON:  None.  FINDINGS: MRI THORACIC SPINE FINDINGS  Alignment:  Maintained.  Vertebrae: The patient has a severe T4 compression fracture and there appears to be an inferior endplate compression fracture T3.  Cord: Severe myelomalacia and cystic change are seen from approximately T2-3 to T5-6. No normal appearing cord is seen  in this region. No syrinx is identified. There is no pathologic enhancement after contrast administration.  Paraspinal and other soft tissues: Negative.  Disc levels:  The patient is status post T1-7 fusion and laminectomy. There is disc material or retropulsed bone posterior to the T4 level but the central canal and foramina are widely patent at all levels. Intervertebral disc spaces are otherwise unremarkable.  IMPRESSION: Findings consistent with spinal cord injury secondary to a severe T4 compression fracture with severe myelomalacia and cystic change from approximately T2-3 to T5-6 where no normal appearing cord is visible. Negative for syrinx or central canal or foraminal stenosis.   Electronically Signed By: Drusilla Kanner M.D. On: 12/25/2019 10:04  Thoracic MR w contrast: No results found for this  or any previous visit.  Thoracic CT wo contrast: No results found for this or any previous visit.  Thoracic CT w/wo contrast: No results found for this or any previous visit.  Thoracic CT w/wo contrast: No results found for this or any previous visit.  Thoracic CT w contrast: No results found for this or any previous visit.  Thoracic DG 2-3 views: No results found for this or any previous visit.  Thoracic DG 4 views: No results found for this or any previous visit.  Thoracic DG: No results found for this or any previous visit.  Thoracic DG w/swimmers view: No results found for this or any previous visit.  Thoracic DG Myelogram views: No results found for this or any previous visit.  Thoracic DG Myelogram views: No results found for this or any previous visit.   Lumbosacral Imaging: Lumbar MR wo contrast: Results for orders placed during the hospital encounter of 03/11/23  MR LUMBAR SPINE WO CONTRAST  Narrative CLINICAL DATA:  Thoracic region spinal cord injury.  Severe pain.  EXAM: MRI LUMBAR SPINE WITHOUT CONTRAST  TECHNIQUE: Multiplanar, multisequence MR imaging of the lumbar spine was performed. No intravenous contrast was administered.  COMPARISON:  12/24/2019  FINDINGS: Segmentation:  5 lumbar type vertebral bodies.  Alignment:  Normal  Vertebrae:  Normal  Conus medullaris and cauda equina: Conus extends to the L1-2 level. Two tiny nodules are newly seen along the nerve roots of the cauda equina to the left of midline behind the L1 level and L1-2 disc space level. 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 The significance is doubtful.  Paraspinal and other soft tissues: Negative other than fatty atrophy of the musculature.  Disc levels:  No disc level abnormality at L3-4 or above.  L4-5: Mild bulging of the disc. Mild facet and ligamentous prominence. Mild narrowing of the lateral recesses but no  neural compression.  L5-S1: No disc abnormality. No stenosis. Mild facet osteoarthritis.  IMPRESSION: 1. Two tiny nodules are newly seen along the nerve roots of the cauda equina to the left of midline behind the L1 level and L1-2 disc space level. 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 The significance is doubtful. 2. L4-5: Mild bulging of the disc. Mild facet and ligamentous prominence. Mild narrowing of the lateral recesses but no neural compression. 3. L5-S1: Mild facet osteoarthritis. No stenosis.   Electronically Signed By: Paulina Fusi M.D. On: 03/15/2023 16:05  Lumbar MR wo contrast: No valid procedures specified. Lumbar MR w/wo contrast: Results for orders placed during the hospital encounter of 12/24/19  MR Lumbar Spine W Wo Contrast  Narrative CLINICAL DATA:  History of prior thoracic spine surgery  for spinal cord injury. Increasing numbness across the spine and pain both above and below the level of injury. Question syrinx or cord compression.  EXAM: MRI LUMBAR SPINE WITHOUT AND WITH CONTRAST  TECHNIQUE: Multiplanar and multiecho pulse sequences of the lumbar spine were obtained without and with intravenous contrast.  CONTRAST:  7.5 mL GADAVIST IV SOLN  COMPARISON:  None.  FINDINGS: Segmentation:  Standard.  Alignment: Normal.  Vertebrae:  No fracture, evidence of discitis, or bone lesion.  Conus medullaris and cauda equina: Conus extends to the L1-2 level. Conus and cauda equina appear normal.  Paraspinal and other soft tissues: Normal.  Disc levels:  Disc height and hydration are normal at all levels. The central canal and foramina are widely patent throughout.  IMPRESSION: Normal MR lumbar spine MR.   Electronically Signed By: Drusilla Kanner M.D. On: 12/25/2019 10:17  Lumbar MR w/wo contrast: No results found for this or any previous visit.  Lumbar MR w contrast: No results found for  this or any previous visit.  Lumbar CT wo contrast: No results found for this or any previous visit.  Lumbar CT w/wo contrast: No results found for this or any previous visit.  Lumbar CT w/wo contrast: No results found for this or any previous visit.  Lumbar CT w contrast: No results found for this or any previous visit.  Lumbar DG 1V: No results found for this or any previous visit.  Lumbar DG 1V (Clearing): No results found for this or any previous visit.  Lumbar DG 2-3V (Clearing): No results found for this or any previous visit.  Lumbar DG 2-3 views: No results found for this or any previous visit.  Lumbar DG (Complete) 4+V: No results found for this or any previous visit.        Lumbar DG F/E views: No results found for this or any previous visit.        Lumbar DG Bending views: No results found for this or any previous visit.        Lumbar DG Myelogram views: No results found for this or any previous visit.  Lumbar DG Myelogram: No results found for this or any previous visit.  Lumbar DG Myelogram: No results found for this or any previous visit.  Lumbar DG Myelogram: No results found for this or any previous visit.  Lumbar DG Myelogram Lumbosacral: No results found for this or any previous visit.  Lumbar DG Diskogram views: No results found for this or any previous visit.  Lumbar DG Diskogram views: No results found for this or any previous visit.  Lumbar DG Epidurogram OP: No results found for this or any previous visit.  Lumbar DG Epidurogram IP: No valid procedures specified.  Sacroiliac Joint Imaging: Sacroiliac Joint DG: No results found for this or any previous visit.  Sacroiliac Joint MR w/wo contrast: No results found for this or any previous visit.  Sacroiliac Joint MR wo contrast: No results found for this or any previous visit.   Spine Imaging: Whole Spine DG Myelogram views: No results found for this or any previous visit.  Whole Spine MR Mets  screen: No results found for this or any previous visit.  Whole Spine MR Mets screen: No results found for this or any previous visit.  Whole Spine MR w/wo: No results found for this or any previous visit.  MRA Spinal Canal w/ cm: No results found for this or any previous visit.  MRA Spinal Canal wo/ cm: No valid procedures specified.  MRA Spinal Canal w/wo cm: No results found for this or any previous visit.  Spine Outside MR Films: No results found for this or any previous visit.  Spine Outside CT Films: No results found for this or any previous visit.  CT-Guided Biopsy: No results found for this or any previous visit.  CT-Guided Needle Placement: No results found for this or any previous visit.  DG Spine outside: No results found for this or any previous visit.  IR Spine outside: No results found for this or any previous visit.  NM Spine outside: No results found for this or any previous visit.   Hip Imaging: Hip-R MR w contrast: No results found for this or any previous visit.  Hip-L MR w contrast: No results found for this or any previous visit.  Hip-R MR w/wo contrast: No results found for this or any previous visit.  Hip-L MR w/wo contrast: No results found for this or any previous visit.  Hip-R MR wo contrast: No results found for this or any previous visit.  Hip-L MR wo contrast: No results found for this or any previous visit.  Hip-R CT w contrast: No results found for this or any previous visit.  Hip-L CT w contrast: No results found for this or any previous visit.  Hip-R CT w/wo contrast: No results found for this or any previous visit.  Hip-L CT w/wo contrast: No results found for this or any previous visit.  Hip-R CT wo contrast: No results found for this or any previous visit.  Hip-L CT wo contrast: No results found for this or any previous visit.  Hip-R DG 2-3 views: No results found for this or any previous visit.  Hip-L DG 2-3 views: No results  found for this or any previous visit.  Hip-R DG Arthrogram: No results found for this or any previous visit.  Hip-L DG Arthrogram: No results found for this or any previous visit.  Hip-B DG Bilateral: No results found for this or any previous visit.  Hip-B DG Bilateral (5V): No results found for this or any previous visit.   Knee Imaging: Knee-R MR w contrast: No results found for this or any previous visit.  Knee-L MR w/o contrast: No results found for this or any previous visit.  Knee-R MR w/wo contrast: No results found for this or any previous visit.  Knee-L MR w/wo contrast: No results found for this or any previous visit.  Knee-R MR wo contrast: No results found for this or any previous visit.  Knee-L MR wo contrast: No results found for this or any previous visit.  Knee-R CT w contrast: No results found for this or any previous visit.  Knee-L CT w contrast: No results found for this or any previous visit.  Knee-R CT w/wo contrast: No results found for this or any previous visit.  Knee-L CT w/wo contrast: No results found for this or any previous visit.  Knee-R CT wo contrast: No results found for this or any previous visit.  Knee-L CT wo contrast: No results found for this or any previous visit.  Knee-R DG 1-2 views: No results found for this or any previous visit.  Knee-L DG 1-2 views: No results found for this or any previous visit.  Knee-R DG 3 views: No results found for this or any previous visit.  Knee-L DG 3 views: No results found for this or any previous visit.  Knee-R DG 4 views: No results found for this or any previous visit.  Knee-L DG 4 views: No results found for this or any previous visit.  Knee-R DG Arthrogram: No results found for this or any previous visit.  Knee-L DG Arthrogram: No results found for this or any previous visit.   Ankle Imaging: Ankle-R DG Complete: No results found for this or any previous visit.  Ankle-L DG Complete:  No results found for this or any previous visit.   Foot Imaging: Foot-R DG Complete: No results found for this or any previous visit.  Foot-L DG Complete: No results found for this or any previous visit.   Elbow Imaging: Elbow-R DG Complete: No results found for this or any previous visit.  Elbow-L DG Complete: No results found for this or any previous visit.   Wrist Imaging: Wrist-R DG Complete: No results found for this or any previous visit.  Wrist-L DG Complete: No results found for this or any previous visit.   Hand Imaging: Hand-R DG Complete: No results found for this or any previous visit.  Hand-L DG Complete: No results found for this or any previous visit.   Complexity Note: Imaging results reviewed.                         ROS  Cardiovascular: {Hx; Cardiovascular History:210120525} Pulmonary or Respiratory: {Hx; Pumonary and/or Respiratory History:210120523} Neurological: {Hx; Neurological:210120504} Psychological-Psychiatric: {Hx; Psychological-Psychiatric History:210120512} Gastrointestinal: {Hx; Gastrointestinal:210120527} Genitourinary: {Hx; Genitourinary:210120506} Hematological: {Hx; Hematological:210120510} Endocrine: {Hx; Endocrine history:210120509} Rheumatologic: {Hx; Rheumatological:210120530} Musculoskeletal: {Hx; Musculoskeletal:210120528} Work History: {Hx; Work history:210120514}  Allergies  Ms. Mayon is allergic to penicillins and penicillin g.  Laboratory Chemistry Profile   Renal No results found for: "BUN", "CREATININE", "LABCREA", "BCR", "GFR", "GFRAA", "GFRNONAA", "SPECGRAV", "PHUR", "PROTEINUR"   Electrolytes No results found for: "NA", "K", "CL", "CALCIUM", "MG", "PHOS"   Hepatic No results found for: "AST", "ALT", "ALBUMIN", "ALKPHOS", "AMYLASE", "LIPASE", "AMMONIA"   ID No results found for: "LYMEIGGIGMAB", "HIV", "SARSCOV2NAA", "STAPHAUREUS", "MRSAPCR", "HCVAB", "PREGTESTUR", "RMSFIGG", "QFVRPH1IGG", "QFVRPH2IGG"    Bone Lab Results  Component Value Date   VD25OH 29.6 (L) 03/06/2020     Endocrine Lab Results  Component Value Date   TSH 3.200 03/06/2020     Neuropathy No results found for: "VITAMINB12", "FOLATE", "HGBA1C", "HIV"   CNS No results found for: "COLORCSF", "APPEARCSF", "RBCCOUNTCSF", "WBCCSF", "POLYSCSF", "LYMPHSCSF", "EOSCSF", "PROTEINCSF", "GLUCCSF", "JCVIRUS", "CSFOLI", "IGGCSF", "LABACHR", "ACETBL"   Inflammation (CRP: Acute  ESR: Chronic) No results found for: "CRP", "ESRSEDRATE", "LATICACIDVEN"   Rheumatology No results found for: "RF", "ANA", "LABURIC", "URICUR", "LYMEIGGIGMAB", "LYMEABIGMQN", "HLAB27"   Coagulation No results found for: "INR", "LABPROT", "APTT", "PLT", "DDIMER", "LABHEMA", "VITAMINK1", "AT3"   Cardiovascular No results found for: "BNP", "CKTOTAL", "CKMB", "TROPONINI", "HGB", "HCT", "LABVMA", "EPIRU", "EPINEPH24HUR", "NOREPRU", "NOREPI24HUR", "DOPARU", "DOPAM24HRUR"   Screening No results found for: "SARSCOV2NAA", "COVIDSOURCE", "STAPHAUREUS", "MRSAPCR", "HCVAB", "HIV", "PREGTESTUR"   Cancer No results found for: "CEA", "CA125", "LABCA2"   Allergens No results found for: "ALMOND", "APPLE", "ASPARAGUS", "AVOCADO", "BANANA", "BARLEY", "BASIL", "BAYLEAF", "GREENBEAN", "LIMABEAN", "WHITEBEAN", "BEEFIGE", "REDBEET", "BLUEBERRY", "BROCCOLI", "CABBAGE", "MELON", "CARROT", "CASEIN", "CASHEWNUT", "CAULIFLOWER", "CELERY"     Note: Lab results reviewed.  PFSH  Drug: Ms. Fines  reports no history of drug use. Alcohol:  reports that she does not currently use alcohol. Tobacco:  reports that she has quit smoking. She has never used smokeless tobacco. Medical:  has a past medical history of Hypoglycemia and Paraplegia (HCC). Family: family history includes Breast cancer in her sister; Emphysema in her father; Heart disease in her father; Mental illness in her  brother and sister; Stroke in her brother.  Past Surgical History:  Procedure Laterality Date    ABDOMINAL SURGERY     BREAST LUMPECTOMY     CARPAL TUNNEL RELEASE     HIP ARTHROSCOPY W/ LABRAL REPAIR     KNEE ARTHROPLASTY     PARTIAL HYSTERECTOMY     ROTATOR CUFF REPAIR     SPINAL FIXATION SURGERY     THORACOTOMY     TONSILECTOMY/ADENOIDECTOMY WITH MYRINGOTOMY     TRIGGER FINGER RELEASE     WISDOM TOOTH EXTRACTION     Active Ambulatory Problems    Diagnosis Date Noted   Paraplegia at T4 level (HCC) 04/19/2019   Spasticity 10/28/2019   Neurogenic bowel 10/28/2019   Neurogenic bladder 10/28/2019   Suprapubic catheter (HCC) 10/28/2019   Myofascial pain dysfunction syndrome 10/28/2019   Syrinx (HCC) 12/04/2019   Hair abnormality 03/06/2020   Chronic pain due to trauma 05/01/2020   Nerve pain 05/14/2021   Long COVID 05/14/2021   Wheelchair dependence 03/16/2022   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   Pes anserinus bursitis 05/27/2015   Arthrodesis status 03/25/2019   Status post left partial knee replacement 04/08/2014   Body mass index (BMI) 34.0-34.9, adult 08/08/2023   Closed fracture of fourth thoracic vertebra with routine healing 03/02/2019   Debility 03/13/2019   Former smoker 01/19/2023   Fracture of multiple ribs of left side 03/03/2019   Fracture of third thoracic vertebra (HCC) 03/03/2019   Left hip pain 05/27/2015   Left knee pain 03/11/2013   Obesity due to excess calories 08/08/2023   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   Primary osteoarthritis of both hands 09/10/2018   Right shoulder pain 07/15/2014   T10 spinal cord injury (HCC) 07/15/2020   Thoracic radiculopathy 08/22/2022   Trauma 03/02/2019   Unilateral primary osteoarthritis, left knee 03/11/2013   Urinary retention 05/20/2019   Varus deformity of knee 03/11/2013   Knee joint replacement status, unspecified laterality 06/26/2012   Aftercare following left knee joint replacement surgery 07/18/2016   Arthritis, senescent 03/25/2014   Resolved Ambulatory Problems    Diagnosis Date Noted   No  Resolved Ambulatory Problems   Past Medical History:  Diagnosis Date   Hypoglycemia    Paraplegia (HCC)    Constitutional Exam  General appearance: Well nourished, well developed, and well hydrated. In no apparent acute distress There were no vitals filed for this visit. BMI Assessment: Estimated body mass index is 31.38 kg/m as calculated from the following:   Height as of 08/02/23: 5\' 6"  (1.676 m).   Weight as of 08/02/23: 194 lb 6.4 oz (88.2 kg).  BMI interpretation table: BMI level Category Range association with higher incidence of chronic pain  <18 kg/m2 Underweight   18.5-24.9 kg/m2 Ideal body weight   25-29.9 kg/m2 Overweight Increased incidence by 20%  30-34.9 kg/m2 Obese (Class I) Increased incidence by 68%  35-39.9 kg/m2 Severe obesity (Class II) Increased incidence by 136%  >40 kg/m2 Extreme obesity (Class III) Increased incidence by 254%   Patient's current BMI Ideal Body weight  There is no height or weight on file to calculate BMI. Patient weight not recorded   BMI Readings from Last 4 Encounters:  08/02/23 31.38 kg/m  04/26/23 32.80 kg/m  01/23/23 31.93 kg/m  10/21/22 31.93 kg/m   Wt Readings from Last 4 Encounters:  08/02/23 194 lb 6.4 oz (88.2 kg)  04/26/23 203 lb 3.2 oz (92.2 kg)  07/13/22 197 lb 12.8 oz (89.7 kg)  03/16/22 199 lb (90.3 kg)  Psych/Mental status: Alert, oriented x 3 (person, place, & time)       Eyes: PERLA Respiratory: No evidence of acute respiratory distress  Assessment  Primary Diagnosis & Pertinent Problem List: {There were no encounter diagnoses. (Refresh or delete this SmartLink)}  Visit Diagnosis (New problems to examiner): No diagnosis found. Plan of Care (Initial workup plan)  Note: Ms. Ehrsam was reminded that as per protocol, today's visit has been an evaluation only. We have not taken over the patient's controlled substance management.  Problem-specific plan: No problem-specific Assessment & Plan notes found for  this encounter. Lab Orders  No laboratory test(s) ordered today   Imaging Orders  No imaging studies ordered today   Referral Orders  No referral(s) requested today   Procedure Orders    No procedure(s) ordered today   Pharmacotherapy (current): Medications ordered:  No orders of the defined types were placed in this encounter.  Medications administered during this visit: Allen Kell. Difranco "Beth" had no medications administered during this visit.   Analgesic Pharmacotherapy:  Opioid Analgesics: For patients currently taking or requesting to take opioid analgesics, in accordance with [facility] Guidelines, we will assess their risks and indications for the use of these substances. After completing our evaluation, we may offer recommendations, but we no longer take patients for medication management. The prescribing physician will ultimately decide, based on his/her training and level of comfort whether to adopt any of the recommendations, including whether or not to prescribe such medicines.  Membrane stabilizer: To be determined at a later time  Muscle relaxant: To be determined at a later time  NSAID: To be determined at a later time  Other analgesic(s): To be determined at a later time   Interventional management options: Ms. Tercero was informed that there is no guarantee that she would be a candidate for interventional therapies. The decision will be based on the results of diagnostic studies, as well as Ms. Fortner's risk profile.  Procedure(s) under consideration:  Pending results of ordered studies      Interventional Therapies  Risk Factors  Considerations  Medical Comorbidities:     Planned  Pending:      Under consideration:   Pending   Completed:   None at this time   Therapeutic  Palliative (PRN) options:   None established   Completed by other providers:   None reported       Provider-requested follow-up: No follow-ups on  file.  Future Appointments  Date Time Provider Department Center  08/30/2023 11:00 AM Delano Metz, MD [facility] None  11/03/2023 11:20 AM Lovorn, Aundra Millet, MD [facility]    Duration of encounter: *** minutes.  Total time on encounter, as per AMA guidelines included both the face-to-face and non-face-to-face time personally spent by the physician and/or other qualified health care professional(s) on the day of the encounter (includes time in activities that require the physician or other qualified health care professional and does not include time in activities normally performed by clinical staff). Physician's time may include the following activities when performed: Preparing to see the patient (e.g., pre-charting review of records, searching for previously ordered imaging, lab work, and nerve conduction tests) Review of prior analgesic pharmacotherapies. Reviewing PMP Interpreting ordered tests (e.g., lab work, imaging, nerve conduction tests) Performing post-procedure evaluations, including interpretation of diagnostic procedures Obtaining and/or reviewing separately obtained history Performing a medically appropriate examination and/or evaluation Counseling and educating the patient/family/caregiver Ordering medications, tests, or procedures Referring and communicating with other  health care professionals (when not separately reported) Documenting clinical information in the electronic or other health record Independently interpreting results (not separately reported) and communicating results to the patient/ family/caregiver Care coordination (not separately reported)  Note by: Oswaldo Done, MD (TTS technology used. I apologize for any typographical errors that were not detected and corrected.) Date: 08/30/2023; Time: 1:01 PM

## 2023-08-29 DIAGNOSIS — Z79891 Long term (current) use of opiate analgesic: Secondary | ICD-10-CM | POA: Insufficient documentation

## 2023-08-29 DIAGNOSIS — S22049S Unspecified fracture of fourth thoracic vertebra, sequela: Secondary | ICD-10-CM | POA: Insufficient documentation

## 2023-08-29 DIAGNOSIS — R937 Abnormal findings on diagnostic imaging of other parts of musculoskeletal system: Secondary | ICD-10-CM | POA: Insufficient documentation

## 2023-08-29 DIAGNOSIS — T50905S Adverse effect of unspecified drugs, medicaments and biological substances, sequela: Secondary | ICD-10-CM | POA: Insufficient documentation

## 2023-08-29 DIAGNOSIS — Z789 Other specified health status: Secondary | ICD-10-CM | POA: Insufficient documentation

## 2023-08-29 DIAGNOSIS — Z79899 Other long term (current) drug therapy: Secondary | ICD-10-CM | POA: Insufficient documentation

## 2023-08-29 DIAGNOSIS — Z981 Arthrodesis status: Secondary | ICD-10-CM | POA: Insufficient documentation

## 2023-08-29 DIAGNOSIS — G894 Chronic pain syndrome: Secondary | ICD-10-CM | POA: Insufficient documentation

## 2023-08-29 DIAGNOSIS — F1190 Opioid use, unspecified, uncomplicated: Secondary | ICD-10-CM | POA: Insufficient documentation

## 2023-08-29 DIAGNOSIS — M899 Disorder of bone, unspecified: Secondary | ICD-10-CM | POA: Insufficient documentation

## 2023-08-29 DIAGNOSIS — S22039S Unspecified fracture of third thoracic vertebra, sequela: Secondary | ICD-10-CM | POA: Insufficient documentation

## 2023-08-29 NOTE — Patient Instructions (Signed)

## 2023-08-30 ENCOUNTER — Encounter: Payer: Self-pay | Admitting: Pain Medicine

## 2023-08-30 ENCOUNTER — Ambulatory Visit: Payer: BC Managed Care – PPO | Attending: Pain Medicine | Admitting: Pain Medicine

## 2023-08-30 VITALS — BP 100/63 | HR 73 | Temp 97.2°F | Resp 14 | Ht 66.0 in | Wt 194.0 lb

## 2023-08-30 DIAGNOSIS — Z79891 Long term (current) use of opiate analgesic: Secondary | ICD-10-CM | POA: Diagnosis present

## 2023-08-30 DIAGNOSIS — S24102S Unspecified injury at T2-T6 level of thoracic spinal cord, sequela: Secondary | ICD-10-CM | POA: Diagnosis present

## 2023-08-30 DIAGNOSIS — S22049S Unspecified fracture of fourth thoracic vertebra, sequela: Secondary | ICD-10-CM | POA: Diagnosis present

## 2023-08-30 DIAGNOSIS — G8921 Chronic pain due to trauma: Secondary | ICD-10-CM | POA: Insufficient documentation

## 2023-08-30 DIAGNOSIS — Z79899 Other long term (current) drug therapy: Secondary | ICD-10-CM | POA: Insufficient documentation

## 2023-08-30 DIAGNOSIS — Z981 Arthrodesis status: Secondary | ICD-10-CM | POA: Diagnosis present

## 2023-08-30 DIAGNOSIS — R937 Abnormal findings on diagnostic imaging of other parts of musculoskeletal system: Secondary | ICD-10-CM | POA: Insufficient documentation

## 2023-08-30 DIAGNOSIS — Z789 Other specified health status: Secondary | ICD-10-CM | POA: Insufficient documentation

## 2023-08-30 DIAGNOSIS — Z993 Dependence on wheelchair: Secondary | ICD-10-CM | POA: Diagnosis present

## 2023-08-30 DIAGNOSIS — G8220 Paraplegia, unspecified: Secondary | ICD-10-CM | POA: Insufficient documentation

## 2023-08-30 DIAGNOSIS — M899 Disorder of bone, unspecified: Secondary | ICD-10-CM | POA: Diagnosis present

## 2023-08-30 DIAGNOSIS — K592 Neurogenic bowel, not elsewhere classified: Secondary | ICD-10-CM | POA: Insufficient documentation

## 2023-08-30 DIAGNOSIS — F1190 Opioid use, unspecified, uncomplicated: Secondary | ICD-10-CM | POA: Diagnosis present

## 2023-08-30 DIAGNOSIS — R252 Cramp and spasm: Secondary | ICD-10-CM | POA: Insufficient documentation

## 2023-08-30 DIAGNOSIS — S22039S Unspecified fracture of third thoracic vertebra, sequela: Secondary | ICD-10-CM | POA: Diagnosis present

## 2023-08-30 DIAGNOSIS — S24103S Unspecified injury at T7-T10 level of thoracic spinal cord, sequela: Secondary | ICD-10-CM | POA: Insufficient documentation

## 2023-08-30 DIAGNOSIS — G894 Chronic pain syndrome: Secondary | ICD-10-CM | POA: Insufficient documentation

## 2023-08-30 DIAGNOSIS — N319 Neuromuscular dysfunction of bladder, unspecified: Secondary | ICD-10-CM | POA: Insufficient documentation

## 2023-08-30 DIAGNOSIS — T50905S Adverse effect of unspecified drugs, medicaments and biological substances, sequela: Secondary | ICD-10-CM | POA: Diagnosis present

## 2023-08-30 DIAGNOSIS — G904 Autonomic dysreflexia: Secondary | ICD-10-CM | POA: Insufficient documentation

## 2023-09-11 ENCOUNTER — Encounter: Payer: Self-pay | Admitting: Physical Medicine and Rehabilitation

## 2023-09-13 MED ORDER — GABAPENTIN 600 MG PO TABS: 600.0000 mg | ORAL_TABLET | Freq: Four times a day (QID) | ORAL | 5 refills | Status: DC

## 2023-09-21 ENCOUNTER — Other Ambulatory Visit: Payer: Self-pay | Admitting: Physical Medicine and Rehabilitation

## 2023-09-21 ENCOUNTER — Telehealth: Payer: Self-pay | Admitting: Physical Medicine and Rehabilitation

## 2023-09-21 MED ORDER — METHADONE HCL 10 MG PO TABS: 15.0000 mg | ORAL_TABLET | Freq: Two times a day (BID) | ORAL | 0 refills | Status: DC

## 2023-09-21 NOTE — Telephone Encounter (Signed)
Pt used to be on methadone 15 mg BID- increase next Rx back to this dose to see if could help nerve pain-  Let nursing know to let pt know- thanks- ML

## 2023-09-22 NOTE — Telephone Encounter (Signed)
I let Ms Baugh know and Dr lovorn spoke with her as well.

## 2023-10-06 ENCOUNTER — Encounter: Payer: Self-pay | Admitting: Physical Medicine and Rehabilitation

## 2023-11-03 ENCOUNTER — Encounter
Payer: BC Managed Care – PPO | Attending: Physical Medicine and Rehabilitation | Admitting: Physical Medicine and Rehabilitation

## 2023-11-03 ENCOUNTER — Encounter: Payer: Self-pay | Admitting: Physical Medicine and Rehabilitation

## 2023-11-03 VITALS — BP 106/64 | HR 78 | Ht 66.0 in | Wt 189.6 lb

## 2023-11-03 DIAGNOSIS — G8921 Chronic pain due to trauma: Secondary | ICD-10-CM | POA: Insufficient documentation

## 2023-11-03 DIAGNOSIS — S24102S Unspecified injury at T2-T6 level of thoracic spinal cord, sequela: Secondary | ICD-10-CM | POA: Diagnosis not present

## 2023-11-03 DIAGNOSIS — R252 Cramp and spasm: Secondary | ICD-10-CM | POA: Insufficient documentation

## 2023-11-03 DIAGNOSIS — Z9359 Other cystostomy status: Secondary | ICD-10-CM | POA: Diagnosis present

## 2023-11-03 DIAGNOSIS — Z993 Dependence on wheelchair: Secondary | ICD-10-CM | POA: Insufficient documentation

## 2023-11-03 DIAGNOSIS — S22039S Unspecified fracture of third thoracic vertebra, sequela: Secondary | ICD-10-CM | POA: Insufficient documentation

## 2023-11-03 MED ORDER — METHADONE HCL 10 MG PO TABS: 15.0000 mg | ORAL_TABLET | Freq: Two times a day (BID) | ORAL | 0 refills | Status: DC

## 2023-11-03 NOTE — Progress Notes (Signed)
Subjective:    Patient ID: Angela Gilmore, female    DOB: 1966/09/18, 57 y.o.   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 With increased nerve pain.  Biggest limiter is nerve pain. Also tachycardic; Long COVID- new dx.  Pt is here for f/u on Nerve pain and SCI.   3-4 days after increase in Gabapentin pain was really great- and then pain came back. - then pain came right back.   The only good days she's had, was the first 3-4 days.     RLE is completely numb and a little pain around L butt cheek.   Was told should take CBD gummies- by online SCI groups.  Knew cannot take due to Opiate contract.    Takes Duloxetine 120 mg daily-  Gabapentin 1200 mg 3x/day.   Still painful enough that can't do anything.  In pressure relief for 3 days,   Did ischial bursa injection.  Didn't do Piriformis injection.   Methadone 10 mg BID- never went back to 15 mg BID.   Cannot stay awake on Oxycodone- with gabapentin Gabapentin makes her really sleepy.   Doesn't get really awake til 2pm.  So hasn't been mixing in any Oxycodone.   Cannot even do anything- cannot put on Xmas tree, very depressing.   Still on Trileptal- 300 mg BID- didn't take off it.   Only takes VALIUM AT NIGHT DUE TO SEDATION.   Still on baclofen 20 mg QID.    Pain Inventory Average Pain 7 Pain Right Now 5 My pain is constant, burning, and tingling  LOCATION OF PAIN  thighs, knees, toes, buttocks, feet hips  BOWEL Number of stools per week: 4 Oral laxative use Yes  Type of laxative [employer] Enema or suppository use Yes  History of colostomy No    BLADDER Suprapubic    Mobility ability to climb steps?  no do you drive?  no use a wheelchair needs help with transfers Do you have any goals in this area?  yes  Function disabled: date disabled 02/2019 I need  assistance with the following:  dressing, bathing, toileting, meal prep, household duties, and shopping Do you have any goals in this area?  yes  Neuro/Psych bladder control problems bowel control problems numbness tingling trouble walking spasms depression  Prior Studies Any changes since last visit?  no  Physicians involved in your care Any changes since last visit?  no   Family History  Problem Relation Age of Onset   Heart disease Father    Emphysema Father    Breast cancer Sister    Mental illness Sister    Stroke Brother    Mental illness Brother    Social History   Socioeconomic History   Marital status: Married    Spouse name: Not on file   Number of children: Not on file   Years of education: Not on file   Highest education level: Not on file  Occupational History   Not on file  Tobacco Use   Smoking status: Former   Smokeless tobacco: Never  Vaping Use   Vaping status: Never Used  Substance and Sexual Activity   Alcohol use: Not Currently   Drug use: Never   Sexual activity: Not on file  Other Topics Concern   Not on file  Social History Narrative   Not  on file   Social Drivers of Health   Financial Resource Strain: Low Risk  (07/14/2023)   Received from [facility]   Overall Financial Resource Strain (CARDIA)    Difficulty of Paying Living Expenses: Not hard at all  Food Insecurity: Patient Declined (07/14/2023)   Received from [facility]   Hunger Vital Sign    Worried About Running Out of Food in the Last Year: Patient declined    Ran Out of Food in the Last Year: Patient declined  Transportation Needs: No Transportation Needs (07/28/2023)   Received from [facility]    In the past 12 months, has lack of reliable transportation kept you from medical appointments, meetings, work or from getting things needed for daily living? : No  Physical Activity: Unknown (07/14/2023)   Received from [facility]   Exercise Vital  Sign    Days of Exercise per Week: 0 days    Minutes of Exercise per Session: Not on file  Stress: Stress Concern Present (07/14/2023)   Received from [facility] - Occupational Stress Questionnaire    Feeling of Stress : Very much  Social Connections: Unknown (02/09/2023)   Received from [employer]   Social Network    Social Network: Not on file   Past Surgical History:  Procedure Laterality Date   ABDOMINAL SURGERY     BREAST LUMPECTOMY     CARPAL TUNNEL RELEASE     HIP ARTHROSCOPY W/ LABRAL REPAIR     KNEE ARTHROPLASTY     PARTIAL HYSTERECTOMY     ROTATOR CUFF REPAIR     SPINAL FIXATION SURGERY     THORACOTOMY     TONSILECTOMY/ADENOIDECTOMY WITH MYRINGOTOMY     TRIGGER FINGER RELEASE     WISDOM TOOTH EXTRACTION     Past Medical History:  Diagnosis Date   Hypoglycemia    Paraplegia (HCC)    BP 106/64   Pulse 78   Ht 5\' 6"  (1.676 m)   Wt 189 lb 9.6 oz (86 kg)   SpO2 96%   BMI 30.60 kg/m   Opioid Risk Score:   Fall Risk Score:  `1  Depression screen [facility] 2/9     11/03/2023   11:32 AM 08/30/2023   11:04 AM 04/26/2023   10:07 AM 01/23/2023   10:04 AM 07/13/2022   10:11 AM 03/16/2022   10:37 AM 12/13/2021   10:26 AM  Depression screen PHQ 2/9  Decreased Interest 1 0 1 1 3 2 1   Down, Depressed, Hopeless 1 0 1 1 3 2 1   PHQ - 2 Score 2 0 2 2 6 4 2     Review of Systems  Constitutional:        Weight gain  Cardiovascular:  Positive for leg swelling.  Musculoskeletal:        Spasms  Neurological:  Positive for numbness.       Tingling  Psychiatric/Behavioral:         Depression  All other systems reviewed and are negative.      Objective:   Physical Exam  Awake, alert, appropriate, in manual w/c; today; accompanied by husband, NAD MAS of 1 in LE's B/L- No spasms seen today.  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 With increased nerve pain.  Biggest limiter is nerve pain. Also tachycardic; Long COVID- new dx.  Pt is here for f/u on Nerve pain and SCI.   So increase methadone 15 mg 2x/day-  to help get pain better controlled.    2.  Trileptal is sedating- and not helping, so suggest taking her off it.    3. We ARE heading in the  right direction. Because not crying. Her goal is painting again. But is too miserable.    4. Might be able to take Oxycodone IF can come off Trileptal.  Might be less sedated.    5.  Has tried Lyrica in past- I'm concerned since hadn't helped her before.   But can try to switch over again.    6. Went over imaging again- still cannot find a reason to explain such severe pain.    7.  Pain makes spasticity worse-    8.  Look into getting piriformis injections and see if helped- ischial bursa injection did nothing.    9. 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 Not sure injections will help. Educated on central sensitization as well.    10.  Con't Suprapubic catheter- changed monthly.   11. F/u in 2 months with Riley Lam and f/u with me in 4 months- double appt-   12. Don't break up Cymbalta and give at night.     I spent a total of  32  minutes on total care today- >50% coordination of care- due to  discussion about pain; central sensitizaition; injections and went over meds.

## 2023-11-03 NOTE — Patient Instructions (Signed)
  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 With increased nerve pain.  Biggest limiter is nerve pain. Also tachycardic; Long COVID- new dx.  Pt is here for f/u on Nerve pain and SCI.   So increase methadone 15 mg 2x/day-  to help get pain better controlled.    2.  Trileptal is sedating- and not helping, so suggest taking her off it.    3. We ARE heading in the  right direction. Because not crying. Her goal is painting again. But is too miserable.    4. Might be able to take Oxycodone IF can come off Trileptal.  Might be less sedated.    5.  Has tried Lyrica in past- I'm concerned since hadn't helped her before.   But can try to switch over again.    6. Went over imaging again- still cannot find a reason to explain such severe pain.    7.  Pain makes spasticity worse-    8.  Look into getting piriformis injections and see if helped- ischial bursa injection did nothing.    9. 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 Not sure injections will help. Educated on central sensitization as well.    10.  Con't Suprapubic catheter- changed monthly.   11. F/u in 2 months with Riley Lam and f/u with me in 4 months- double appt-   12. Don't break up Cymbalta and give at night.

## 2023-11-07 ENCOUNTER — Telehealth: Payer: Self-pay

## 2023-11-07 NOTE — Telephone Encounter (Signed)
Prior Auth (EOC) ID: [id number] Methadone

## 2023-11-13 NOTE — Telephone Encounter (Signed)
Approved 11/08/23-05/08/24

## 2023-12-06 ENCOUNTER — Other Ambulatory Visit: Payer: Self-pay | Admitting: Physical Medicine and Rehabilitation

## 2023-12-06 NOTE — Telephone Encounter (Signed)
 Patient called in again stating she will be out of medicine tomorrow, says she sees you next month and would like to know if you can send in a prescription for her   11/05/2023 11/03/2023 1  Methadone  Hcl 10 Mg Tablet 90.00 30 Me Lov [id number] Nor ([phone]) 0/0 141.00 MME Private Pay [place]

## 2023-12-07 MED ORDER — METHADONE HCL 10 MG PO TABS: 15.0000 mg | ORAL_TABLET | Freq: Two times a day (BID) | ORAL | 0 refills | Status: DC

## 2024-01-03 ENCOUNTER — Ambulatory Visit: Payer: BC Managed Care – PPO | Admitting: Registered Nurse

## 2024-01-04 ENCOUNTER — Encounter: Payer: BC Managed Care – PPO | Attending: Physical Medicine and Rehabilitation | Admitting: Registered Nurse

## 2024-01-04 ENCOUNTER — Encounter: Payer: Self-pay | Admitting: Registered Nurse

## 2024-01-04 ENCOUNTER — Ambulatory Visit: Payer: BC Managed Care – PPO | Admitting: Registered Nurse

## 2024-01-04 VITALS — BP 108/67 | HR 86

## 2024-01-04 DIAGNOSIS — Z9359 Other cystostomy status: Secondary | ICD-10-CM | POA: Diagnosis present

## 2024-01-04 DIAGNOSIS — Z5181 Encounter for therapeutic drug level monitoring: Secondary | ICD-10-CM | POA: Diagnosis not present

## 2024-01-04 DIAGNOSIS — S22039S Unspecified fracture of third thoracic vertebra, sequela: Secondary | ICD-10-CM | POA: Insufficient documentation

## 2024-01-04 DIAGNOSIS — G8921 Chronic pain due to trauma: Secondary | ICD-10-CM | POA: Diagnosis present

## 2024-01-04 DIAGNOSIS — Z993 Dependence on wheelchair: Secondary | ICD-10-CM | POA: Diagnosis present

## 2024-01-04 DIAGNOSIS — Z79891 Long term (current) use of opiate analgesic: Secondary | ICD-10-CM | POA: Diagnosis not present

## 2024-01-04 DIAGNOSIS — S24102S Unspecified injury at T2-T6 level of thoracic spinal cord, sequela: Secondary | ICD-10-CM | POA: Insufficient documentation

## 2024-01-04 DIAGNOSIS — G894 Chronic pain syndrome: Secondary | ICD-10-CM | POA: Insufficient documentation

## 2024-01-04 DIAGNOSIS — R252 Cramp and spasm: Secondary | ICD-10-CM | POA: Diagnosis present

## 2024-01-04 MED ORDER — METHADONE HCL 10 MG PO TABS: 15.0000 mg | ORAL_TABLET | Freq: Two times a day (BID) | ORAL | 0 refills | Status: DC

## 2024-01-04 MED ORDER — BACLOFEN 20 MG PO TABS: 20.0000 mg | ORAL_TABLET | Freq: Four times a day (QID) | ORAL | 5 refills | Status: DC

## 2024-01-04 NOTE — Progress Notes (Signed)
Subjective:    Patient ID: Angela Gilmore, female    DOB: Jan 05, 1966, 58 y.o.   MRN: [id number]  HPI: Angela Gilmore is a 58 y.o. female who returns for follow up appointment for chronic pain and medication refill. She reports her pain is located in her lower back and bilateral lower extremities. She rates her pain 6. She reports she will be resuming physical therapy and was encouraged to continue Passive ROM, she verbalizes understanding.    [name] Morphine equivalent is 141.00 MME.   Oral Swab was Performed today.    Pain Inventory Average Pain 6 Pain Right Now 6 My pain is tingling and numbness  In the last 24 hours, has pain interfered with the following? General activity 5 Relation with others 5 Enjoyment of life 5 What TIME of day is your pain at its worst? varies Sleep (in general) Fair  Pain is worse with:  moving into chair Pain improves with: medication Relief from Meds: 8  Family History  Problem Relation Age of Onset   Heart disease Father    Emphysema Father    Breast cancer Sister    Mental illness Sister    Stroke Brother    Mental illness Brother    Social History   Socioeconomic History   Marital status: Married    Spouse name: Not on file   Number of children: Not on file   Years of education: Not on file   Highest education level: Not on file  Occupational History   Not on file  Tobacco Use   Smoking status: Former   Smokeless tobacco: Never  Vaping Use   Vaping status: Never Used  Substance and Sexual Activity   Alcohol use: Not Currently   Drug use: Never   Sexual activity: Not on file  Other Topics Concern   Not on file  Social History Narrative   Not on file   Social Drivers of Health   Financial Resource Strain: Low Risk  (07/14/2023)   Received from [facility]   Overall Financial Resource Strain (CARDIA)    Difficulty of Paying Living Expenses: Not hard at all  Food Insecurity: Patient Declined  (07/14/2023)   Received from [facility]   Hunger Vital Sign    Worried About Running Out of Food in the Last Year: Patient declined    Ran Out of Food in the Last Year: Patient declined  Transportation Needs: No Transportation Needs (07/28/2023)   Received from [employer]    In the past 12 months, has lack of reliable transportation kept you from medical appointments, meetings, work or from getting things needed for daily living? : No  Physical Activity: Unknown (07/14/2023)   Received [facility]   Exercise Vital Sign    Days of Exercise per Week: 0 days    Minutes of Exercise per Session: Not on file  Stress: Stress Concern Present (07/14/2023)   Received from [facility] - Occupational Stress Questionnaire    Feeling of Stress : Very much  Social Connections: Unknown (02/09/2023)   Received from [employer] [facility]   Social Network    Social Network: Not on file   Past Surgical History:  Procedure Laterality Date   ABDOMINAL SURGERY     BREAST LUMPECTOMY     CARPAL TUNNEL RELEASE     HIP ARTHROSCOPY W/ LABRAL REPAIR     KNEE ARTHROPLASTY  PARTIAL HYSTERECTOMY     ROTATOR CUFF REPAIR     SPINAL FIXATION SURGERY     THORACOTOMY     TONSILECTOMY/ADENOIDECTOMY WITH MYRINGOTOMY     TRIGGER FINGER RELEASE     WISDOM TOOTH EXTRACTION     Past Surgical History:  Procedure Laterality Date   ABDOMINAL SURGERY     BREAST LUMPECTOMY     CARPAL TUNNEL RELEASE     HIP ARTHROSCOPY W/ LABRAL REPAIR     KNEE ARTHROPLASTY     PARTIAL HYSTERECTOMY     ROTATOR CUFF REPAIR     SPINAL FIXATION SURGERY     THORACOTOMY     TONSILECTOMY/ADENOIDECTOMY WITH MYRINGOTOMY     TRIGGER FINGER RELEASE     WISDOM TOOTH EXTRACTION     Past Medical History:  Diagnosis Date   Hypoglycemia    Paraplegia (HCC)    There were no vitals taken for this visit.  Opioid Risk Score:   Fall Risk Score:  `1  Depression  screen [facility] 2/9     11/03/2023   11:32 AM 08/30/2023   11:04 AM 04/26/2023   10:07 AM 01/23/2023   10:04 AM 07/13/2022   10:11 AM 03/16/2022   10:37 AM 12/13/2021   10:26 AM  Depression screen PHQ 2/9  Decreased Interest 1 0 1 1 3 2 1   Down, Depressed, Hopeless 1 0 1 1 3 2 1   PHQ - 2 Score 2 0 2 2 6 4 2       Review of Systems  Musculoskeletal:  Positive for back pain.       Bilateral leg pain Bilateral foot pain  All other systems reviewed and are negative.     Objective:   Physical Exam Vitals and nursing note reviewed.  Constitutional:      Appearance: Normal appearance.  Cardiovascular:     Rate and Rhythm: Normal rate and regular rhythm.     Pulses: Normal pulses.     Heart sounds: Normal heart sounds.  Pulmonary:     Effort: Pulmonary effort is normal.     Breath sounds: Normal breath sounds.  Genitourinary:    Comments: Supra Pubic Cath: Yellow urine noted with Odor/ Urology Following.  Musculoskeletal:     Comments: Normal Muscle Bulk and Muscle Testing Reveals:  Upper Extremities: Full ROM and Muscle Strength  5/5  Lower Extremities: Paralysis    Arrived in wheelchair   Skin:    General: Skin is warm and dry.  Neurological:     Mental Status: She is alert and oriented to person, place, and time.  Psychiatric:        Mood and Affect: Mood normal.        Behavior: Behavior normal.         Assessment & Plan:  Paraplegia: Wheelchair Dependence Continue HEP as tolerated. Continue to Monitor. 01/04/2024 Neurogenic Bowel: Continue Bowel Program. Continue to Monitor. 01/04/2024 Neurogenic Bladder: Supra Pubic Catheter Draining Yellow Urine with odor noted. She has a scheduled appointment with Urology she reports and she was instructed to call her Urologist. She verbalizes understanding. Marland Kitchen 01/04/2024 Spasticity: Continue Baclofen.  current medication regimen. Continue to Monitor. 01/04/2024 Neuropathic Pain: Continue  Gabapentin. Current medication regimen. Continue  to Monitor.  Chronic Pain Syndrome: Refill: Methadone 10 mg 1 1/2 tablet every 12 hours as needed for pain #90 . Second script sent for the following month to accommodate scheduled appointment. We will continue the opioid monitoring program, this consists of regular clinic visits, examinations,  urine drug screen, pill counts as well as use of [place] Controlled Substance Reporting system. A 12 month History has been reviewed on the [place] Controlled Substance Reporting System on 01/04/2024.    F/U with Dr Berline Chough in 2months.

## 2024-01-09 LAB — DRUG TOX MONITOR 1 W/CONF, ORAL FLD
Alprazolam: NEGATIVE ng/mL (ref <0.50)
Aminoclonazepam: NEGATIVE ng/mL (ref <0.50)
Amphetamines: NEGATIVE ng/mL (ref <10)
Barbiturates: NEGATIVE ng/mL (ref <10)
Benzodiazepines: POSITIVE ng/mL — AB (ref <0.50)
Buprenorphine: NEGATIVE ng/mL (ref <0.10)
Chlordiazepoxide: NEGATIVE ng/mL (ref <0.50)
Clonazepam: NEGATIVE ng/mL (ref <0.50)
Cocaine: NEGATIVE ng/mL (ref <5.0)
Diazepam: 0.82 ng/mL — ABNORMAL HIGH (ref <0.50)
EDDP: NEGATIVE ng/mL (ref <5.0)
Fentanyl: NEGATIVE ng/mL (ref <0.10)
Flunitrazepam: NEGATIVE ng/mL (ref <0.50)
Flurazepam: NEGATIVE ng/mL (ref <0.50)
Heroin Metabolite: NEGATIVE ng/mL (ref <1.0)
Lorazepam: NEGATIVE ng/mL (ref <0.50)
MARIJUANA: NEGATIVE ng/mL (ref <2.5)
MDMA: NEGATIVE ng/mL (ref <10)
Meprobamate: NEGATIVE ng/mL (ref <2.5)
Methadone: 63.9 ng/mL — ABNORMAL HIGH (ref <5.0)
Methadone: POSITIVE ng/mL — AB (ref <5.0)
Midazolam: NEGATIVE ng/mL (ref <0.50)
Nicotine Metabolite: NEGATIVE ng/mL (ref <5.0)
Nordiazepam: 2.26 ng/mL — ABNORMAL HIGH (ref <0.50)
Opiates: NEGATIVE ng/mL (ref <2.5)
Oxazepam: NEGATIVE ng/mL (ref <0.50)
Phencyclidine: NEGATIVE ng/mL (ref <10)
Tapentadol: NEGATIVE ng/mL (ref <5.0)
Temazepam: NEGATIVE ng/mL (ref <0.50)
Tramadol: NEGATIVE ng/mL (ref <5.0)
Triazolam: NEGATIVE ng/mL (ref <0.50)
Zolpidem: NEGATIVE ng/mL (ref <5.0)

## 2024-01-09 LAB — DRUG TOX ALC METAB W/CON, ORAL FLD: Alcohol Metabolite: NEGATIVE ng/mL (ref <25)

## 2024-01-20 ENCOUNTER — Other Ambulatory Visit: Payer: Self-pay | Admitting: Physical Medicine and Rehabilitation

## 2024-01-29 ENCOUNTER — Telehealth: Payer: Self-pay | Admitting: Physical Medicine and Rehabilitation

## 2024-01-29 NOTE — Telephone Encounter (Signed)
 Pt called and had to cancel her appt w [facility] 5/9 and she stated we called to change it in may to reschedule. She was a little upset that [facility] availibility is in June and stated that was too long of a wait. She stated she wants a call back from [facility].

## 2024-02-01 NOTE — Telephone Encounter (Signed)
 Left voicemail for patient to call office.

## 2024-02-01 NOTE — Telephone Encounter (Signed)
 Have attempted to call pt- left message to call back to clinic to return her call;- it sounds like she had to cancel appt 3/9 however I don't have an appointment until May and she wants another appt- Will ask Front desk to put her on Waiting list-

## 2024-02-12 ENCOUNTER — Encounter: Payer: Self-pay | Admitting: Physical Medicine and Rehabilitation

## 2024-02-13 NOTE — Telephone Encounter (Signed)
 Talked ot pt- will have her drop Gabapentin to 1200/600/1200 mg x 3-4 days, then 600/600 1200 mg and see if that helps sedation and hand shaking.   We discussed the issues with the appointments and explained we won't deprive her of meds if  cancel appt.

## 2024-03-04 ENCOUNTER — Ambulatory Visit: Payer: BC Managed Care – PPO | Admitting: Physical Medicine and Rehabilitation

## 2024-03-06 ENCOUNTER — Ambulatory Visit: Payer: BC Managed Care – PPO | Admitting: Physical Medicine and Rehabilitation

## 2024-03-07 ENCOUNTER — Encounter: Payer: Self-pay | Admitting: Physical Medicine and Rehabilitation

## 2024-03-17 ENCOUNTER — Encounter: Payer: Self-pay | Admitting: Physical Medicine and Rehabilitation

## 2024-03-18 NOTE — Telephone Encounter (Signed)
 We discussed increasing Gabapentin  back to 3600 mg/day- take 1800 mg at bedtime and 900 mg in AM and 900 mg in afternoon.   Also can take up to 800 mg/day of magnesium- start 200-400 mg daily and work up- causes loose stools.

## 2024-03-29 ENCOUNTER — Ambulatory Visit: Payer: BC Managed Care – PPO | Admitting: Physical Medicine and Rehabilitation

## 2024-04-04 ENCOUNTER — Other Ambulatory Visit: Payer: Self-pay

## 2024-04-04 MED ORDER — METHADONE HCL 10 MG PO TABS: 15.0000 mg | ORAL_TABLET | Freq: Two times a day (BID) | ORAL | 0 refills | Status: DC

## 2024-04-05 ENCOUNTER — Telehealth: Payer: Self-pay

## 2024-04-05 NOTE — Telephone Encounter (Signed)
 Called and spoke with patient to make aware of refill sent to pharmacy by Dr. Raynaldo Call.

## 2024-04-13 ENCOUNTER — Other Ambulatory Visit: Payer: Self-pay | Admitting: Physical Medicine and Rehabilitation

## 2024-04-29 ENCOUNTER — Encounter: Payer: Self-pay | Admitting: Physical Medicine and Rehabilitation

## 2024-04-29 ENCOUNTER — Encounter: Attending: Physical Medicine and Rehabilitation | Admitting: Physical Medicine and Rehabilitation

## 2024-04-29 VITALS — BP 105/68 | HR 75 | Ht 66.0 in | Wt 244.0 lb

## 2024-04-29 DIAGNOSIS — G8220 Paraplegia, unspecified: Secondary | ICD-10-CM | POA: Diagnosis not present

## 2024-04-29 DIAGNOSIS — G894 Chronic pain syndrome: Secondary | ICD-10-CM

## 2024-04-29 DIAGNOSIS — R252 Cramp and spasm: Secondary | ICD-10-CM

## 2024-04-29 DIAGNOSIS — Z993 Dependence on wheelchair: Secondary | ICD-10-CM

## 2024-04-29 DIAGNOSIS — S22039S Unspecified fracture of third thoracic vertebra, sequela: Secondary | ICD-10-CM

## 2024-04-29 DIAGNOSIS — M792 Neuralgia and neuritis, unspecified: Secondary | ICD-10-CM | POA: Diagnosis present

## 2024-04-29 DIAGNOSIS — S24102S Unspecified injury at T2-T6 level of thoracic spinal cord, sequela: Secondary | ICD-10-CM | POA: Diagnosis present

## 2024-04-29 MED ORDER — METHADONE HCL 10 MG PO TABS: 15.0000 mg | ORAL_TABLET | Freq: Two times a day (BID) | ORAL | 0 refills | Status: DC

## 2024-04-29 MED ORDER — METHADONE HCL 10 MG PO TABS: 15.0000 mg | ORAL_TABLET | Freq: Two times a day (BID) | ORAL | 0 refills | Status: AC

## 2024-04-29 NOTE — Patient Instructions (Signed)
 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 With increased nerve pain.  Biggest limiter is nerve pain. Also tachycardic; Long COVID- new dx.  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 Can try standing power w/c-  At next visit- July 18th- we will send referral for w/c evaluation- needs standing w/c due to severe uncontrolled pain and spasticity- which standing will improve- also neurogenic bowel and constipation- will also improve with daily standing-  also helps control orthostatic hypotension- will help body adapt to low BP more.    3. Will change Duloxetine  to 120 mg NIGHTLY- don't take in AM   4. Change the gabapentin  to 600 mg in AM and 1800 mg at night.   5.  Voltaren/Diclofenac gel- over the counter-  orange and white tube- apply dime sized amount over B/L wrist-  dollop- for entire hands B/L  6. Carpal tunnel wrist brace B/L- start with wearing at night- but if helpful at night at all, then can wear all day if necessary.    7.  If not effective, one of my colleagues can do carpal tunnel steroid injections for you-   8. Can always take 1 gabapentin  at dinner time and maybe 1 Duloxetine , and the rest of Gabapentin  which is 1200 mg and 1 duloxetine  30-45 minutes before bedtime.   9.  Do  Senna in AM-mild laxative 2 tabs in AM of Senna- mild laxative- Or increase increase miralax to 2 capfuls in AM-   10. Colace/stool softener is for hard bm's-   as needed  11. Common for SCI's to develop resistance to bowel program/constipation- every 5-10 years.  Interest in Colostomy.  Hard to sleep on back/sides- sleeps on stomach, so might not be good with colostomy.    12. Filled out  paperwork for [facility]- will need to bring next time so I have to compare.   13. F/U in 6 weeks- as already scheduled-  make another appt in September double appt- SCI- make in November-

## 2024-04-29 NOTE — Progress Notes (Signed)
 Subjective:    Patient ID: Angela Gilmore, female    DOB: 02-18-1966, 58 y.o.   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 With increased nerve pain.  Biggest limiter is nerve pain. Also tachycardic; Long COVID- new dx.  Pt is here for f/u on Nerve pain and SCI.   Heard from Dr Garald Jumbo-  Nurse said "why coming to see them".   He felt there was nothing he could do there.  Was referred to [facility]-   Could put in pump- T2- in a couple  Tried Ketamine- and tried 1x- didn't work-  Dr Kapural said would possibly retry Ketamine-  Back to same place on [facility]-  Dr Broadus Canes? Dr name different on the paper- 05/15/24.   Due in August for new w/c.   Cannot stay awake, because so flipping sleepy-   Dropped Gabapentin  to 2400mg /day Cymbalta - 120 mg takes in AM-    Having difficulties with constipation- in last few months.   Still on baclofen  20 mg QID  Also has appointment for ketamine AND appointment with [facility] in [place] For pain pump placement.   Been Off Trileptal- been off "forever" .   Spasticity better overall unless in severe pain.  The only time is when in a lot of pain.   Usually 7/10 with numbness on 1 side, but doesn't stay numb- heat makes it spike and goes "un-numb" and is "terrible" burning-  Wakes up numb-  Tries to get husband roll her to side to side to help control pai more.   Hips and arms burning so bad. Even when rolled back and forth.  Arms just started burning- in hands- go numb - even washing hair- hands are os numb- Right now- R hand is numb and goes up to shoulder- hands are really bad- R>L Hx of CTS But doesn't know CTS why would come back.  Hard to hold phone- hands shake and fingers will shake or move and hit "things'- and  Like fingers jumping and shaking- when holds phone outwards-  shakes constantly and will occur in both hands- R>L.  Started on R then L hand followed- R hand is numb- entire hand- dorsum and palmar surface-  Washing hair is "crazy"  Bowel program- taking hours with bowel program even wqith dig stim and miraalx.    Pain Inventory Average Pain 10 Pain Right Now 7 My pain is constant, burning, tingling, and Stinging and numbness  In the last 24 hours, has pain interfered with the following? General activity 10 Relation with others 10 Enjoyment of life 10 What TIME of day is your pain at its worst? morning , daytime, evening, night, and varies Sleep (in general) Fair  Pain is worse with: sitting and inactivity Pain improves with: medication Relief from Meds: 0  Family History  Problem Relation Age of Onset   Heart disease Father    Emphysema Father    Breast cancer Sister    Mental illness Sister    Stroke Brother    Mental illness Brother    Social History   Socioeconomic History   Marital status: Married    Spouse name: Not on file   Number of children: Not on file   Years of education: Not on file   Highest education level: Not on file  Occupational History   Not on file  Tobacco Use   Smoking status: Former   Smokeless tobacco: Never  Vaping Use   Vaping status: Never Used  Substance and Sexual Activity   Alcohol use: Not Currently   Drug use: Never   Sexual activity: Not on file  Other Topics Concern   Not on file  Social History Narrative   Not on file   Social Drivers of Health   Financial Resource Strain: Patient Declined (04/11/2024)   Received from [facility]   Overall Financial Resource Strain (CARDIA)    Difficulty of Paying Living Expenses: Patient declined  Food Insecurity: Patient Declined (04/11/2024)   Received from [facility]   Hunger Vital Sign    Worried About Running Out of Food in the Last Year: Patient declined    Ran Out of Food in the Last Year: Patient declined  Transportation Needs:  Patient Declined (04/11/2024)   Received from [facility]    Lack of Transportation (Medical): Patient declined    Lack of Transportation (Non-Medical): Patient declined  Physical Activity: Unknown (04/11/2024)   Received from [facility]   Exercise Vital Sign    Days of Exercise per Week: 0 days    Minutes of Exercise per Session: Not on file  Stress: Patient Declined (04/11/2024)   Received from [facility] - Occupational Stress Questionnaire    Feeling of Stress : Patient declined  Social Connections: Socially Isolated (04/11/2024)   Received from [facility]   Social Network    How would you rate your social network (family, work, friends)?: Little participation, lonely and socially isolated   Past Surgical History:  Procedure Laterality Date   ABDOMINAL SURGERY     BREAST LUMPECTOMY     CARPAL TUNNEL RELEASE     HIP ARTHROSCOPY W/ LABRAL REPAIR     KNEE ARTHROPLASTY     PARTIAL HYSTERECTOMY     ROTATOR CUFF REPAIR     SPINAL FIXATION SURGERY     THORACOTOMY     TONSILECTOMY/ADENOIDECTOMY WITH MYRINGOTOMY     TRIGGER FINGER RELEASE     WISDOM TOOTH EXTRACTION     Past Surgical History:  Procedure Laterality Date   ABDOMINAL SURGERY     BREAST LUMPECTOMY     CARPAL TUNNEL RELEASE     HIP ARTHROSCOPY W/ LABRAL REPAIR     KNEE ARTHROPLASTY     PARTIAL HYSTERECTOMY     ROTATOR CUFF REPAIR     SPINAL FIXATION SURGERY     THORACOTOMY     TONSILECTOMY/ADENOIDECTOMY WITH MYRINGOTOMY     TRIGGER FINGER RELEASE     WISDOM TOOTH EXTRACTION     Past Medical History:  Diagnosis Date   Hypoglycemia    Paraplegia (HCC)    BP 105/68 (BP Location: Left Arm, Patient Position: Sitting)   Pulse 75   Ht 5\' 6"  (1.676 m)   Wt 244 lb (110.7 kg)   SpO2 92%   BMI 39.38 kg/m   Opioid Risk Score:   Fall Risk Score:  `1  Depression screen [facility] 2/9     04/29/2024    3:50 PM 11/03/2023   11:32 AM  08/30/2023   11:04 AM 04/26/2023   10:07 AM 01/23/2023   10:04 AM 07/13/2022   10:11 AM 03/16/2022   10:37 AM  Depression screen PHQ 2/9  Decreased Interest 3 1 0 1 1 3 2   Down, Depressed,  Hopeless 3 1 0 1 1 3 2   PHQ - 2 Score 6 2 0 2 2 6 4   Altered sleeping 0        Tired, decreased energy 3        Change in appetite 0        Feeling bad or failure about yourself  2        Trouble concentrating 0        Moving slowly or fidgety/restless 0        Suicidal thoughts 0        PHQ-9 Score 11        Difficult doing work/chores Extremely dIfficult           Review of Systems  All other systems reviewed and are negative.      Objective:   Physical Exam  Awake, alert, appropriate, in manual w/c- accompanied by husband, NAD Not tearful today  Tinels' (+) at wrist for R hand- at wrist and at elbow- and at ulnar aspect of R wrist as well Tinels' (-) at all three places on L hand Phalen's (+) on R hand in <30 seconds Numbness in R hand- no difference between 2nd and 5th digit or each side of 4th digit Slightly less decrease in L hand compared to R hand Has thenar eminence and hypothenar eminence atrophy on R hand, not really on L hand.   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 With increased nerve pain.  Biggest limiter is nerve pain. Also tachycardic; Long COVID- new dx.  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 Can try standing power w/c-  At next visit- July 18th- we will send referral for w/c evaluation- needs standing w/c due to severe uncontrolled pain and spasticity- which standing will  improve- also neurogenic bowel and constipation- will also improve with daily standing-  also helps control orthostatic hypotension- will help body adapt to low BP more.    3. Will change Duloxetine  to 120 mg NIGHTLY- don't take in AM   4. Change the gabapentin  to 600 mg in AM and 1800 mg at night.   5.  Voltaren/Diclofenac gel- over the counter-  orange and white tube- apply dime sized amount over B/L wrist-  dollop- for entire hands B/L  6. Carpal tunnel wrist brace B/L- start with wearing at night- but if helpful at night at all, then can wear all day if necessary.    7.  If not effective, one of my colleagues can do carpal tunnel steroid injections for you-   8. Can always take 1 gabapentin  at dinner time and maybe 1 Duloxetine , and the rest of Gabapentin  which is 1200 mg and 1 duloxetine  30-45 minutes before bedtime.   9.  Do  Senna in AM-mild laxative 2 tabs in AM of Senna- mild laxative- Or increase increase miralax to 2 capfuls in AM-   10. Colace/stool softener is for hard bm's-   as needed  11. Common for SCI's to  develop resistance to bowel program/constipation- every 5-10 years.  Interest in Colostomy.  Hard to sleep on back/sides- sleeps on stomach, so might not be good with colostomy.    12. Filled out paperwork for [facility]- will need to bring next time so I have to compare.   13. F/U in 6 weeks- as already scheduled-  make another appt in September double appt- SCI- make in November-    I spent a total of 54   minutes on total care today- >50% coordination of care- due to  discussion as detailed above- very complex visit- going over bowels, constipation; nerve pain- and changing timing of meds- and w/c to evaluate/order at next visit- went over issues with this as well

## 2024-05-15 ENCOUNTER — Other Ambulatory Visit: Payer: Self-pay | Admitting: Registered Nurse

## 2024-05-21 ENCOUNTER — Telehealth: Payer: Self-pay | Admitting: Registered Nurse

## 2024-05-21 NOTE — Telephone Encounter (Signed)
 Dr Cornelio note reviewed.  No mentioned of Baclofen . Last seen patient 12/2023.  Call placed to Ms. Vogan, need to know if she is taking the Baclofen  and how often. Awaiting a return call.

## 2024-05-22 ENCOUNTER — Telehealth: Payer: Self-pay

## 2024-05-22 NOTE — Telephone Encounter (Signed)
 Message left from patient on 05/21/24.  Message addressed on 05/21/24.

## 2024-06-07 ENCOUNTER — Encounter: Payer: Self-pay | Admitting: Physical Medicine and Rehabilitation

## 2024-06-07 ENCOUNTER — Encounter: Attending: Physical Medicine and Rehabilitation | Admitting: Physical Medicine and Rehabilitation

## 2024-06-07 VITALS — BP 86/50 | HR 79

## 2024-06-07 DIAGNOSIS — Z993 Dependence on wheelchair: Secondary | ICD-10-CM | POA: Insufficient documentation

## 2024-06-07 DIAGNOSIS — R252 Cramp and spasm: Secondary | ICD-10-CM | POA: Diagnosis not present

## 2024-06-07 DIAGNOSIS — G894 Chronic pain syndrome: Secondary | ICD-10-CM | POA: Insufficient documentation

## 2024-06-07 DIAGNOSIS — G8220 Paraplegia, unspecified: Secondary | ICD-10-CM | POA: Insufficient documentation

## 2024-06-07 DIAGNOSIS — G8921 Chronic pain due to trauma: Secondary | ICD-10-CM | POA: Diagnosis present

## 2024-06-07 DIAGNOSIS — Z5181 Encounter for therapeutic drug level monitoring: Secondary | ICD-10-CM | POA: Diagnosis present

## 2024-06-07 DIAGNOSIS — Z79891 Long term (current) use of opiate analgesic: Secondary | ICD-10-CM | POA: Insufficient documentation

## 2024-06-07 DIAGNOSIS — G959 Disease of spinal cord, unspecified: Secondary | ICD-10-CM | POA: Insufficient documentation

## 2024-06-07 MED ORDER — METHADONE HCL 10 MG PO TABS: 15.0000 mg | ORAL_TABLET | Freq: Two times a day (BID) | ORAL | 0 refills | Status: DC

## 2024-06-07 MED ORDER — BACLOFEN 20 MG PO TABS: 20.0000 mg | ORAL_TABLET | Freq: Four times a day (QID) | ORAL | 5 refills | Status: AC

## 2024-06-07 NOTE — Addendum Note (Signed)
 Addended by: JAMA FLEMING T on: 06/07/2024 02:52 PM   Modules accepted: Orders

## 2024-06-07 NOTE — Progress Notes (Signed)
 Subjective:    Patient ID: Angela Gilmore, female    DOB: [birth year], 58 y.o.   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 With increased nerve pain.  Biggest limiter is nerve pain. Also tachycardic; Long COVID- new dx.  Pt is here for f/u on Nerve pain and SCI.   Saw Dr Billie-  Made appts for her-  Also seen Dr Trudy.   Trying to come off Methadone - went through pure hell -crying and screaming due to increased pain.  Making 1 change per month- down 5 mg initially; just dropped to 10 mg BID-  Monday- not doing great- but maybe not any worse than on 15 mg.  She's in pain constantly-  Has tried to take Oxycodone  a few times- for back pain- 2-3x in last 5 weeks.  Didn't do much.   Had visit with Dr Smith- seen 2x.  Last time was agitated and aggravated-  due to pain, when saw him. .  Last seen Wednesday-  he wants to do a [place] stimulator-  In cervical region.   Dr Smith said if stimulator doesn't work, then would move to a pain pump.   Went back to taking Valium  2x/day-  1 week ago- maybe a mild amount of improvement.    Doesn't sleep much at night- tries to sleep prone- and unfortunately, if sleeps all night, still sleeps all day.   Stays leaned back in w/c all day- and low back and sacrum hurts from sleeping in w/c all day.  Sleeps all day still.   Has dropped her phone so often because loses sensation in hands and then drops it- and decreased sensation in hands B/L.   Pain Inventory Average Pain 9 Pain Right Now 7 My pain is burning and tingling  In the last 24 hours, has pain interfered with the following? General activity 1 Relation with others 1 Enjoyment of life 0 What TIME of day is your pain at its worst? varies Sleep (in general) Fair  Pain is worse with: . Pain improves with: nothing Relief from Meds:  0  Family History  Problem Relation Age of Onset   Heart disease Father    Emphysema Father    Breast cancer Sister    Mental illness Sister    Stroke Brother    Mental illness Brother    Social History   Socioeconomic History   Marital status: Married    Spouse name: Not on file   Number of children: Not on file   Years of education: Not on file   Highest education level: Not on file  Occupational History   Not on file  Tobacco Use   Smoking status: Former   Smokeless tobacco: Never  Vaping Use   Vaping status: Never Used  Substance and Sexual Activity   Alcohol use: Not Currently   Drug use: Never   Sexual activity: Not on file  Other Topics Concern   Not on file  Social History Narrative   Not on file   Social Drivers of Health   Financial Resource Strain: Patient Declined (04/11/2024)   Received from [facility]   Overall Financial Resource Strain (CARDIA)    Difficulty of Paying Living Expenses: Patient declined  Food Insecurity: Low Risk  (05/15/2024)   Received from [facility]   Hunger Vital Sign  Within the past 12 months, you worried that your food would run out before you got money to buy more: Never true    Within the past 12 months, the food you bought just didn't last and you didn't have money to get more. : Never true  Transportation Needs: No Transportation Needs (05/15/2024)   Received from [facility]    In the past 12 months, has lack of reliable transportation kept you from medical appointments, meetings, work or from getting things needed for daily living? : No  Physical Activity: Unknown (04/11/2024)   Received from [facility]   Exercise Vital Sign    On average, how many days per week do you engage in moderate to strenuous exercise (like a brisk walk)?: 0 days    Minutes of Exercise per Session: Not on file  Stress: Patient Declined (04/11/2024)   Received from [facility] - Occupational Stress Questionnaire    Feeling of Stress : Patient declined  Social Connections: Socially Isolated (04/11/2024)   Received from [facility]   Social Network    How would you rate your social network (family, work, friends)?: Little participation, lonely and socially isolated   Past Surgical History:  Procedure Laterality Date   ABDOMINAL SURGERY     BREAST LUMPECTOMY     CARPAL TUNNEL RELEASE     HIP ARTHROSCOPY W/ LABRAL REPAIR     KNEE ARTHROPLASTY     PARTIAL HYSTERECTOMY     ROTATOR CUFF REPAIR     SPINAL FIXATION SURGERY     THORACOTOMY     TONSILECTOMY/ADENOIDECTOMY WITH MYRINGOTOMY     TRIGGER FINGER RELEASE     WISDOM TOOTH EXTRACTION     Past Surgical History:  Procedure Laterality Date   ABDOMINAL SURGERY     BREAST LUMPECTOMY     CARPAL TUNNEL RELEASE     HIP ARTHROSCOPY W/ LABRAL REPAIR     KNEE ARTHROPLASTY     PARTIAL HYSTERECTOMY     ROTATOR CUFF REPAIR     SPINAL FIXATION SURGERY     THORACOTOMY     TONSILECTOMY/ADENOIDECTOMY WITH MYRINGOTOMY     TRIGGER FINGER RELEASE     WISDOM TOOTH EXTRACTION     Past Medical History:  Diagnosis Date   Hypoglycemia    Paraplegia (HCC)    BP (!) 86/50   Pulse 79   SpO2 98%   Opioid Risk Score:   Fall Risk Score:  `1  Depression screen [facility] 2/9     04/29/2024    3:50 PM 11/03/2023   11:32 AM 08/30/2023   11:04 AM 04/26/2023   10:07 AM 01/23/2023   10:04 AM 07/13/2022   10:11 AM 03/16/2022   10:37 AM  Depression screen PH[facility] 2/9  Decreased Interest 3 1 0 1 1 3 2   Down, Depressed, Hopeless 3 1 0 1 1 3 2   PHQ - 2 Score 6 2 0 2 2 6 4   Altered sleeping 0        Tired, decreased energy 3        Change in appetite 0        Feeling bad or failure about yourself  2        Trouble concentrating 0        Moving slowly or fidgety/restless 0        Suicidal thoughts 0        PHQ-9 Score 11  Difficult doing work/chores Extremely dIfficult           Review of Systems  Musculoskeletal:   Positive for back pain and myalgias.  All other systems reviewed and are negative.      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 With increased nerve pain.  Biggest limiter is nerve pain. Also tachycardic; Long COVID- new dx.  Pt is here for f/u on Severe Nerve pain and SCI. Having new Hypotension- not clear if due to being in supine so frequently, or due to moderate canal stenosis at C6/7- and Severe foraminal stenosis at C5/6- B/L- per Cervical MRI   IMPRESSION:   1. 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 Consider postcontrast MRI for further evaluation, if not previously characterized.  2. Otherwise compared to outside MRI 03/11/2023, similar overall appearance of the thoracic spine, including marked cord atrophy and distortion from T1 to T6 levels, presumably sequela of prior injury.   Also reviewed Cervical MRI From [facility] on pt's phone- which showed significant canal stenosis at C6/7 and severe foraminal stenosis at C5/6 B/L   BP 86/50- having some dizziness/lightheadedness- likely due to moderate canal stenosis of C6/7-  likely causing cord compression.   2.   Needs referral to NSU- at [facility]- and put on hold intervention for [place] stimulator/Pump until seen by Neurosurgery. Please get a copy of the disk to take to appointment- and if  you can get prior disc, that would be ideal.    3.  Has spasticity of Ue's- but already on spasticity meds.    4.  Also sent refill of Methadone  15 mg 2x/day- can continue to titrate down as tolerated. Don't change more than 1x/month.    5. Last oral drug screen done 01/04/24- needs to be done today.  Per clinic policy.   6. Is due to get new w/c next month- will focus on next visit.    7.  Will refill  baclofen  - sent in for 6 months  8. 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

## 2024-06-07 NOTE — Patient Instructions (Signed)
 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 With increased nerve pain.  Biggest limiter is nerve pain. Also tachycardic; Long COVID- new dx.  Pt is here for f/u on Severe Nerve pain and SCI. Having new Hypotension- not clear if due to being in supine so frequently, or due to moderate canal stenosis at C6/7- and Severe foraminal stenosis at C5/6- B/L- per Cervical MRI   IMPRESSION:   1. 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 Consider postcontrast MRI for further evaluation, if not previously characterized.  2. Otherwise compared to outside MRI 03/11/2023, similar overall appearance of the thoracic spine, including marked cord atrophy and distortion from T1 to T6 levels, presumably sequela of prior injury.   Also reviewed Cervical MRI From [facility] on pt's phone- which showed significant canal stenosis at C6/7 and severe foraminal stenosis at C5/6 B/L   BP 86/50- having some dizziness/lightheadedness- likely due to moderate canal stenosis of C6/7-  likely causing cord compression.   2.   Needs referral to NSU- at [facility]- and put on hold intervention for [place] stimulator/Pump until seen by Neurosurgery. Please get a copy of the disk to take to appointment- and if you can get prior disc, that would be ideal.    3.  Has spasticity of Ue's- but already on spasticity meds.    4.  Also sent refill of Methadone  15 mg 2x/day- can continue to titrate down as tolerated. Don't change more than 1x/month.    5. Last oral drug screen done 01/04/24- needs to be done today.  Per clinic policy.   6. Is due to get new w/c next month- will focus on next visit.    7.  Will refill  baclofen  - sent in for 6 months  8. F/u every 2 months- double appointment- SCI  with paraplegia and forming cervical myelopathy

## 2024-06-11 ENCOUNTER — Telehealth: Payer: Self-pay | Admitting: Physical Medicine and Rehabilitation

## 2024-06-11 NOTE — Telephone Encounter (Signed)
 Needs PA for methadone  (DOLOPHINE ) 10 MG tablet, patient is calling because she only has 2 days left of the medication. She says she missed a call from clinical this morning regarding an insurance problem and would like a call back whenever possible.

## 2024-06-12 LAB — DRUG TOX MONITOR 1 W/CONF, ORAL FLD
Alprazolam: NEGATIVE ng/mL (ref <0.50)
Aminoclonazepam: NEGATIVE ng/mL (ref <0.50)
Amphetamines: NEGATIVE ng/mL (ref <10)
Barbiturates: NEGATIVE ng/mL (ref <10)
Benzodiazepines: POSITIVE ng/mL — AB (ref <0.50)
Buprenorphine: NEGATIVE ng/mL (ref <0.10)
Chlordiazepoxide: NEGATIVE ng/mL (ref <0.50)
Clonazepam: NEGATIVE ng/mL (ref <0.50)
Cocaine: NEGATIVE ng/mL (ref <5.0)
Diazepam: 3.61 ng/mL — ABNORMAL HIGH (ref <0.50)
EDDP: NEGATIVE ng/mL (ref <5.0)
Fentanyl: NEGATIVE ng/mL (ref <0.10)
Flunitrazepam: NEGATIVE ng/mL (ref <0.50)
Flurazepam: NEGATIVE ng/mL (ref <0.50)
Heroin Metabolite: NEGATIVE ng/mL (ref <1.0)
Lorazepam: NEGATIVE ng/mL (ref <0.50)
MARIJUANA: NEGATIVE ng/mL (ref <2.5)
MDMA: NEGATIVE ng/mL (ref <10)
Meprobamate: NEGATIVE ng/mL (ref <2.5)
Methadone: 123.4 ng/mL — ABNORMAL HIGH (ref <5.0)
Methadone: POSITIVE ng/mL — AB (ref <5.0)
Midazolam: NEGATIVE ng/mL (ref <0.50)
Nicotine Metabolite: NEGATIVE ng/mL (ref <5.0)
Nordiazepam: 7.07 ng/mL — ABNORMAL HIGH (ref <0.50)
Opiates: NEGATIVE ng/mL (ref <2.5)
Oxazepam: 0.6 ng/mL — ABNORMAL HIGH (ref <0.50)
Phencyclidine: NEGATIVE ng/mL (ref <10)
Tapentadol: NEGATIVE ng/mL (ref <5.0)
Temazepam: 1.04 ng/mL — ABNORMAL HIGH (ref <0.50)
Tramadol: NEGATIVE ng/mL (ref <5.0)
Triazolam: NEGATIVE ng/mL (ref <0.50)
Zolpidem: NEGATIVE ng/mL (ref <5.0)

## 2024-06-12 LAB — DRUG TOX ALC METAB W/CON, ORAL FLD: Alcohol Metabolite: NEGATIVE ng/mL (ref <25)

## 2024-06-12 NOTE — Telephone Encounter (Addendum)
 She has paid out of pocket for her Rx.

## 2024-06-12 NOTE — Telephone Encounter (Addendum)
 Methadone  10 HCL 10 MG tablet approved 06/11/2024 to 12/12/2024. Approval to be scanned.  Information left on patient voicemail.

## 2024-06-17 ENCOUNTER — Inpatient Hospital Stay
Admission: RE | Admit: 2024-06-17 | Discharge: 2024-06-17 | Disposition: A | Discharge status: Home / Self Care | Payer: Self-pay | Source: Ambulatory Visit | Attending: Orthopedic Surgery | Admitting: Orthopedic Surgery

## 2024-06-17 ENCOUNTER — Other Ambulatory Visit: Payer: Self-pay

## 2024-06-17 DIAGNOSIS — Z049 Encounter for examination and observation for unspecified reason: Secondary | ICD-10-CM

## 2024-06-18 NOTE — Progress Notes (Deleted)
 Referring Physician:  Sharl Suzen DEL, PA-C [address] [place],  [place] [address]  Primary Physician:  Sharl Suzen DEL, PA-C  History of Present Illness: 06/21/2024*** Angela Gilmore has a history of chronic pain, long COVID.   She has paraplegia at T4 with neurogenic bowel and bladder. History of fusion T1-T7.    She sees PMR ([facility]) and is weaning her methadone . Dr. Smith recommended cervical SCS.   Dr. Cornelio wanted her evaluated for cervical stenosis prior to any SCS intervention.        She is taking ELIQUIS. She is taking baclofen , valium , cymbalta , neuronitn, methadone , and oxycodone .    Duration: *** Location: *** Quality: *** Severity: ***  Precipitating: aggravated by *** Modifying factors: made better by *** Weakness: none Timing: ***  Tobacco use: Does not smoke.   Bowel/Bladder Dysfunction: neurogenic bladder  Conservative measures:  Physical therapy: *** has not participated in-referral placed? Multimodal medical therapy including regular antiinflammatories: *** Gabapentin , Cymbalta , methadone , Valium , oxycodone , baclofen , Lyrica  Injections: *** epidural steroid injections  Past Surgery: 03/06/2019 spinal fusion posterior T1-T7; possible T3/T4 laminectomy   Angela Gilmore has ***no symptoms of cervical myelopathy.  The symptoms are causing a significant impact on the patient's life.   Review of Systems:  A 10 point review of systems is negative, except for the pertinent positives and negatives detailed in the HPI.  Past Medical History: Past Medical History:  Diagnosis Date   Hypoglycemia    Paraplegia (HCC)     Past Surgical History: Past Surgical History:  Procedure Laterality Date   ABDOMINAL SURGERY     BREAST LUMPECTOMY     CARPAL TUNNEL RELEASE     HIP ARTHROSCOPY W/ LABRAL REPAIR     KNEE ARTHROPLASTY     PARTIAL HYSTERECTOMY     ROTATOR CUFF REPAIR     SPINAL FIXATION SURGERY     THORACOTOMY      TONSILECTOMY/ADENOIDECTOMY WITH MYRINGOTOMY     TRIGGER FINGER RELEASE     WISDOM TOOTH EXTRACTION      Allergies: Allergies as of 06/25/2024 - Review Complete 06/21/2024  Allergen Reaction Noted   Penicillins Hives, Itching, and Rash 02/16/2005   Penicillin g Rash 03/22/2018    Medications: Outpatient Encounter Medications as of 06/25/2024  Medication Sig   apixaban (ELIQUIS) 5 MG TABS tablet 1 tablet Orally Twice a day for 30 days   baclofen  (LIORESAL ) 20 MG tablet Take 1 tablet (20 mg total) by mouth 4 (four) times daily.   Cholecalciferol (VITAMIN D-3) 25 MCG (1000 UT) CAPS Take 3,000 Units by mouth daily.   D-MANNOSE PO Take by mouth daily at 6 (six) AM. 2 tablets once per day   diazepam  (VALIUM ) 5 MG tablet Take 1 tablet (5 mg total) by mouth in the morning and at bedtime. For spasticity with baclofen    doxycycline (VIBRA-TABS) 100 MG tablet    DULoxetine  (CYMBALTA ) 60 MG capsule TAKE 2 CAPSULES (120 MG TOTAL) BY MOUTH AT BEDTIME   gabapentin  (NEURONTIN ) 600 MG tablet TAKE 1 TABLET (600 MG TOTAL) BY MOUTH 4 (FOUR) TIMES DAILY. X 1 WEEK, THEN INCREASE TO 1200 MG 3X/DAY- FOR NERVE PAIN- DUE TO PARAPLEGIA   Lorcaserin HCl 10 MG TABS Take one daily for a week , then take twice daily   methadone  (DOLOPHINE ) 10 MG tablet Take 1.5 tablets (15 mg total) by mouth every 12 (twelve) hours. 05/29/24- can not fill before this date   methadone  (DOLOPHINE ) 10 MG tablet Take 1.5 tablets (  15 mg total) by mouth every 12 (twelve) hours. For nerve pain   metoprolol succinate (TOPROL-XL) 25 MG 24 hr tablet 1 tablet Orally Once a day   midodrine (PROAMATINE) 10 MG tablet 10 mg 2 (two) times daily.   minocycline (MINOCIN) 100 MG capsule Take by mouth daily.   naloxone  (NARCAN ) nasal spray 4 mg/0.1 mL Take if accidental opioid overdose-   oxyCODONE  (ROXICODONE ) 5 MG immediate release tablet Take 1 tablet (5 mg total) by mouth every 6 (six) hours as needed for breakthrough pain.   pantoprazole (PROTONIX)  40 MG tablet 1 tablet Orally Once a day   Polyethylene Glycol 3350 (MIRALAX PO) MiraLax 17 gram   senna-docusate (SENOKOT-S) 8.6-50 MG tablet Take by mouth.   No facility-administered encounter medications on file as of 06/25/2024.    Social History: Social History   Tobacco Use   Smoking status: Former   Smokeless tobacco: Never  Advertising account planner   Vaping status: Never Used  Substance Use Topics   Alcohol use: Not Currently   Drug use: Never    Family Medical History: Family History  Problem Relation Age of Onset   Heart disease Father    Emphysema Father    Breast cancer Sister    Mental illness Sister    Stroke Brother    Mental illness Brother     Physical Examination: There were no vitals filed for this visit.  General: Patient is well developed, well nourished, calm, collected, and in no apparent distress. Attention to examination is appropriate.  Respiratory: Patient is breathing without any difficulty.   NEUROLOGICAL:     Awake, alert, oriented to person, place, and time.  Speech is clear and fluent. Fund of knowledge is appropriate.   Cranial Nerves: Pupils equal round and reactive to light.  Facial tone is symmetric.    *** ROM of cervical spine *** pain *** posterior cervical tenderness. *** tenderness in bilateral trapezial region.   *** ROM of lumbar spine *** pain *** posterior lumbar tenderness.   No abnormal lesions on exposed skin.   Strength: Side Biceps Triceps Deltoid Interossei Grip Wrist Ext. Wrist Flex.  R 5 5 5 5 5 5 5   L 5 5 5 5 5 5 5    Side Iliopsoas Quads Hamstring PF DF EHL  R 5 5 5 5 5 5   L 5 5 5 5 5 5    Reflexes are ***2+ and symmetric at the biceps, brachioradialis, patella and achilles.   Hoffman's is absent.  Clonus is not present.   Bilateral upper and lower extremity sensation is intact to light touch.     Gait is normal.   ***No difficulty with tandem gait.    Medical Decision Making  Imaging: MRI cervical spine dated  05/08/24:  FINDINGS:  Motion artifact limits evaluation.  Incompletely imaged posterior spinal fusion extending from the cervicothoracic junction inferiorly outside field of view.  Artifact from hardware limits evaluation of the hardware itself and adjacent structures.  Incompletely assessed new focal kyphosis at T3-T4 since 12/24/2019 with associated nonspecific fluid signal present within the disc space.  Straightening of the normal cervical lordosis. Sagittal alignment is preserved.  The cervical vertebral body heights are preserved. No focal concerning marrow signal abnormality is identified in the cervical spine. Multilevel vertebral body endplate osteophytosis is present.  No abnormal spinal cord signal is identified in the cervical spinal cord. Redemonstrated cord volume loss at C7-T1 throughout the visualized upper thoracic spine, with extensive cord abnormality and volume  loss in the visualized upper thoracic spine.  The cord changes are evolved since 12/24/2019, notably with progressive volume loss in the upper thoracic cord.  Multilevel disc desiccation and disc space height loss.  C2-3: Facet arthropathy and uncovertebral joint hypertrophy.  No significant spinal canal narrowing.  Moderate left foraminal narrowing. Moderate right foraminal narrowing.  Overall progressed since prior.  C3-4: There is a posterior osteophyte-disc complex, uncovertebral joint hypertrophy, and facet arthropathy.  Mild spinal canal narrowing. Moderate left foraminal narrowing. Moderate right foraminal narrowing.  No significant change.  C4-5: There is a posterior osteophyte-disc complex, uncovertebral joint hypertrophy, and facet arthropathy.  Mild spinal canal narrowing. Moderate to severe left foraminal narrowing.  Severe right foraminal narrowing.  Foraminal narrowing is progressed.  C5-6: There is a posterior osteophyte-disc complex, uncovertebral joint hypertrophy, and facet arthropathy.  No  significant spinal canal narrowing. Severe left foraminal narrowing. Severe right foraminal narrowing.  Foraminal narrowing is progressed.  C6-7: There is a posterior osteophyte-disc complex, uncovertebral joint hypertrophy, and facet arthropathy.  Moderate spinal canal narrowing. Moderate left foraminal narrowing. Moderate right foraminal narrowing.  Overall progressed since prior.  C7-T1: Facet arthropathy and uncovertebral joint hypertrophy.  No significant spinal canal narrowing.  Moderate left foraminal narrowing.  Moderate right foraminal narrowing.  Foraminal narrowing is mildly progressed.  MPRESSION:   Multilevel degenerative changes, overall progressed as detailed above since 05/08/2024.  Spinal canal narrowing is greatest at C6-C7 where it is moderate.   Redemonstrated incompletely assessed postsurgical changes extending from the cervicothoracic junction inferiorly outside field of view.  Redemonstrated lower cervical and visualized thoracic cord abnormalities as discussed above.   Incompletely assessed new focal kyphosis at T3-T4 since 12/24/2019 with associated nonspecific fluid signal present within the disc space.   Electronically Signed by: Curtistine CHRISTELLA Settler, MD on 05/09/2024 11:49 AM    MRI thoracic spine dated 05/08/24:  FINDINGS:  Posterior instrumented fusion from T1 to T6 levels, with resulting artifacts obscuring and distorting the surrounding anatomy. Within confines of artifacts, grossly anatomic alignment thoracic spine. Chronic appearing moderate wedge-shaped deformity of T3 vertebral body, with about 3 mm retropulsion of the posteroinferior cortex into the spinal canal, where it contributes to mild to moderate canal narrowing at lower T3 level. Otherwise maintained thoracic vertebral body heights. Diffuse disc desiccation, with mild to moderate disc space narrowing throughout burr and mid thoracic spine, to lesser extent in the lower thoracic spine. Other than lower T3  level described above, scattered small disc protrusions, for example at T9-10 level, where it contributes to mild canal narrowing. Within levels of instrumented fusion, neural foramina partially obscured, without definite high-grade foraminal narrowing. Below these levels, mild right T8-9 neural foraminal narrowing. Similar appearance marked cord atrophy and distortion from T1 to T6 levels, similar to prior outside MRI, presumably sequela of prior injury. At T12-L1 level, there are 2 small intrathecal extramedullary nodules, about 4 mm, series 3 image 90 appear slightly larger compared to prior outside MRI when there were about 3 mm  IMPRESSION:   1. 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 Consider postcontrast MRI for further evaluation, if not previously characterized.  2. Otherwise compared to outside MRI 03/11/2023, similar overall appearance of the thoracic spine, including marked cord atrophy and distortion from T1 to T6 levels, presumably sequela of prior injury.  Chen, Yin J, MD - 05/09/2024   I have personally reviewed the images  and agree with the above interpretation.  Assessment and Plan: Ms. Carducci is a pleasant 58 y.o. female has ***  Treatment options discussed with patient and following plan made:   - Order for physical therapy for *** spine ***. Patient to call to schedule appointment. *** - Continue current medications including ***. Reviewed dosing and side effects.  - Prescription for ***. Reviewed dosing and side effects. Take with food.  - Prescription for *** to take prn muscle spasms. Reviewed dosing and side effects. Discussed this can cause drowsiness.  - MRI of *** to further evaluate *** radiculopathy. No improvement time or medications (***).  - Referral to [facility] at [facility] to discuss possible *** injections.  - Will schedule  phone visit to review MRI results once I get them back.   I spent a total of *** minutes in face-to-face and non-face-to-face activities related to this patient's care today including review of outside records, review of imaging, review of symptoms, physical exam, discussion of differential diagnosis, discussion of treatment options, and documentation.   Thank you for involving me in the care of this patient.   Glade Boys PA-C Dept. of Neurosurgery

## 2024-06-21 ENCOUNTER — Encounter: Attending: Physical Medicine and Rehabilitation | Admitting: Physical Medicine and Rehabilitation

## 2024-06-21 ENCOUNTER — Encounter: Payer: Self-pay | Admitting: Physical Medicine and Rehabilitation

## 2024-06-21 VITALS — BP 126/85 | HR 72 | Ht 66.0 in

## 2024-06-21 DIAGNOSIS — G8220 Paraplegia, unspecified: Secondary | ICD-10-CM | POA: Diagnosis present

## 2024-06-21 DIAGNOSIS — M549 Dorsalgia, unspecified: Secondary | ICD-10-CM | POA: Insufficient documentation

## 2024-06-21 DIAGNOSIS — M792 Neuralgia and neuritis, unspecified: Secondary | ICD-10-CM | POA: Insufficient documentation

## 2024-06-21 DIAGNOSIS — S24103D Unspecified injury at T7-T10 level of thoracic spinal cord, subsequent encounter: Secondary | ICD-10-CM | POA: Diagnosis not present

## 2024-06-21 DIAGNOSIS — S24102D Unspecified injury at T2-T6 level of thoracic spinal cord, subsequent encounter: Secondary | ICD-10-CM

## 2024-06-21 DIAGNOSIS — U099 Post covid-19 condition, unspecified: Secondary | ICD-10-CM | POA: Diagnosis not present

## 2024-06-21 DIAGNOSIS — S22049S Unspecified fracture of fourth thoracic vertebra, sequela: Secondary | ICD-10-CM | POA: Insufficient documentation

## 2024-06-21 DIAGNOSIS — X58XXXS Exposure to other specified factors, sequela: Secondary | ICD-10-CM | POA: Diagnosis not present

## 2024-06-21 DIAGNOSIS — N319 Neuromuscular dysfunction of bladder, unspecified: Secondary | ICD-10-CM | POA: Insufficient documentation

## 2024-06-21 DIAGNOSIS — R252 Cramp and spasm: Secondary | ICD-10-CM | POA: Diagnosis not present

## 2024-06-21 DIAGNOSIS — Z993 Dependence on wheelchair: Secondary | ICD-10-CM | POA: Diagnosis not present

## 2024-06-21 DIAGNOSIS — S24103S Unspecified injury at T7-T10 level of thoracic spinal cord, sequela: Secondary | ICD-10-CM | POA: Insufficient documentation

## 2024-06-21 DIAGNOSIS — S24102S Unspecified injury at T2-T6 level of thoracic spinal cord, sequela: Secondary | ICD-10-CM | POA: Insufficient documentation

## 2024-06-21 DIAGNOSIS — R000 Tachycardia, unspecified: Secondary | ICD-10-CM | POA: Diagnosis not present

## 2024-06-21 DIAGNOSIS — S22049D Unspecified fracture of fourth thoracic vertebra, subsequent encounter for fracture with routine healing: Secondary | ICD-10-CM

## 2024-06-21 MED ORDER — METHADONE HCL 10 MG PO TABS: 15.0000 mg | ORAL_TABLET | Freq: Two times a day (BID) | ORAL | 0 refills | Status: DC

## 2024-06-21 NOTE — Progress Notes (Signed)
 Subjective:    Patient ID: Angela Gilmore, female    DOB: Feb 27, 1966, 58 y.o.   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 With increased nerve pain.  Biggest limiter is nerve pain. Also tachycardic; Long COVID- new dx.  Pt is here for f/u on Nerve pain and SCI. Here for W/C evaluation.  Her w/c is 58 years old-  not able to stand with this current w/c.   Just had battery and replaced- joystick needs to be replaced- again.  Casters are worn Torn up wheel-  and back of w/c appears to be bent, cuasing back pain.     Pain Inventory Average Pain 8 Pain Right Now 7 My pain is constant, burning, tingling, and stinging, numbness  LOCATION OF PAIN  Back, buttocks, both legs & feet  BOWEL Number of stools per week: 3 Oral laxative use Yes  Type of laxative Miralax Enema or suppository use Yes  History of colostomy No  Incontinent No   BLADDER Suprapubic    Mobility ability to climb steps?  no do you drive?  no use a wheelchair needs help with transfers Do you have any goals in this area?  yes  Function disabled: date disabled since 2020 I need assistance with the following:  dressing, bathing, toileting, meal prep, household duties, and shopping Do you have any goals in this area?  yes  Neuro/Psych bladder control problems bowel control problems weakness numbness tremor tingling trouble walking spasms dizziness depression  Prior Studies Any changes since last visit?  yes CT/MRI with [facility]  Physicians involved in your care Any changes since last visit?  no   Family History  Problem Relation Age of Onset   Heart disease Father    Emphysema Father    Breast cancer Sister    Mental illness Sister    Stroke Brother    Mental illness Brother    Social History   Socioeconomic History   Marital  status: Married    Spouse name: Not on file   Number of children: Not on file   Years of education: Not on file   Highest education level: Not on file  Occupational History   Not on file  Tobacco Use   Smoking status: Former   Smokeless tobacco: Never  Vaping Use   Vaping status: Never Used  Substance and Sexual Activity   Alcohol use: Not Currently   Drug use: Never   Sexual activity: Not on file  Other Topics Concern   Not on file  Social History Narrative   Not on file   Social Drivers of Health   Financial Resource Strain: Patient Declined (04/11/2024)   Received from [facility]   Overall Financial Resource Strain (CARDIA)    Difficulty of Paying Living Expenses: Patient declined  Food Insecurity: Low Risk  (05/15/2024)   Received from [facility]   Hunger Vital Sign    Within the past 12 months, you worried that your food would run out before you got money to buy more: Never true    Within the past 12 months, the food you bought just didn't last and you didn't have money to get more. : Never true  Transportation Needs: No Transportation Needs (05/15/2024)   Received from [facility]    In the past 12 months,  has lack of reliable transportation kept you from medical appointments, meetings, work or from getting things needed for daily living? : No  Physical Activity: Unknown (04/11/2024)   Received from [facility]   Exercise Vital Sign    On average, how many days per week do you engage in moderate to strenuous exercise (like a brisk walk)?: 0 days    Minutes of Exercise per Session: Not on file  Stress: Patient Declined (04/11/2024)   Received from [facility] - Occupational Stress Questionnaire    Feeling of Stress : Patient declined  Social Connections: Socially Isolated (04/11/2024)   Received from [facility]   Social Network    How would you rate your social network (family, work, friends)?:  Little participation, lonely and socially isolated   Past Surgical History:  Procedure Laterality Date   ABDOMINAL SURGERY     BREAST LUMPECTOMY     CARPAL TUNNEL RELEASE     HIP ARTHROSCOPY W/ LABRAL REPAIR     KNEE ARTHROPLASTY     PARTIAL HYSTERECTOMY     ROTATOR CUFF REPAIR     SPINAL FIXATION SURGERY     THORACOTOMY     TONSILECTOMY/ADENOIDECTOMY WITH MYRINGOTOMY     TRIGGER FINGER RELEASE     WISDOM TOOTH EXTRACTION     Past Medical History:  Diagnosis Date   Hypoglycemia    Paraplegia (HCC)    There were no vitals taken for this visit.  Opioid Risk Score:   Fall Risk Score:  `1  Depression screen [facility] 2/9     04/29/2024    3:50 PM 11/03/2023   11:32 AM 08/30/2023   11:04 AM 04/26/2023   10:07 AM 01/23/2023   10:04 AM 07/13/2022   10:11 AM 03/16/2022   10:37 AM  Depression screen PHQ 2/9  Decreased Interest 3 1 0 1 1 3 2   Down, Depressed, Hopeless 3 1 0 1 1 3 2   PHQ - 2 Score 6 2 0 2 2 6 4   Altered sleeping 0        Tired, decreased energy 3        Change in appetite 0        Feeling bad or failure about yourself  2        Trouble concentrating 0        Moving slowly or fidgety/restless 0        Suicidal thoughts 0        PHQ-9 Score 11        Difficult doing work/chores Extremely dIfficult          Review of Systems     An entire ROS was completed-  and found to be negative except for HPI.  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 With increased nerve pain.  Biggest limiter is nerve pain. Also tachycardic; Long COVID- new dx.  Pt is here for f/u on Nerve pain and SCI.   Patient is due for power  w/c- when got current w/c was 135  lbs; weighs 204 lbs currently- doesn't fit in chair at  all!- back is somewhat bent and causing severe back and buttock  pain- making it difficult to stay up in w/c- pt truly needs and meets criteria for a standing w/c- due to 1. Severe buttock and back pain- that's better with stander in therapy- that makes her take high doses of opiates-- 2. also due to difficulty with bowel program and constipation-  pt has severe difficulty, no matter how much she does bowel program and increasing oral bowel meds. 3. To better control spasticity- this will allow us , hopefully to avoid an ITB pump- which costs tens of thousands of dollars just to place (50k and up)- much less to manage- to fill costs $3-5k each time; so getting a standing w/c will cost the insurance MUCH less to control her spasticity.  4. Also has a bent back on current chair and as stated above, she's gained over 80 lbs since got the w/c- which is extremely common in patients requiring a power w/c.  5 Joystick is also broken- - needs a power w/c- due to management of suprapubic-  as well as needs elevator to get in and out of bed-  with transfer board; 6. Recline for suprapubic as well as tilt in space for pressure relief- 7. Suggest that pt gets custom back due to severe back pain as stated above as well as Stage I pressure ulcers frequently- that has to move constantly to prevent, which increases pain.  8. Patient is a complete paraplegic-  so meets criteria for Level III power w/c.   Also needs a new W/C cushion- ROHO 4 chamber- is 59 years old and worn- needs a new w/c cushion.  Has hx of skin breakdown on lateral leg- pressure ulcer- so needs and meets criteria for ROHO.    Will send Rx to [facility]- will check with Selinda where to send Rx-  wrote written Rx-  4. Can try CBD if NO THC! Is legal here- but NO THC!   5.  Can try VR! We discussed this.  If needs Rx for this- let me know.   6. Hasn't gotten Refill of Oxycodone  since 7/24- doesn't  need refills  7. Last refill on methadone  7/18/2/5- will do refill- almost due   8.  Trp injections weren't helpful- so not interested in them again.    9. F/U in 6 weeks as already ordered-    10. Wanted you to see Dr Claudene or Dr Katrina? Call and see if can see them- at [facility].    I spent a total of 41   minutes on total care today- >50% coordination of care- due to  Complex issues related to W/C and cushion for SCI; pain issues, refills and d/w pt about [facility]

## 2024-06-21 NOTE — Patient Instructions (Addendum)
 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 With increased nerve pain.  Biggest limiter is nerve pain. Also tachycardic; Long COVID- new dx.  Pt is here for f/u on Nerve pain and SCI.   Patient is due for power  w/c- when got current w/c was 135 lbs; weighs 204 lbs currently- doesn't fit in chair at all!- back is somewhat bent and causing severe back and buttock  pain- making it difficult to stay up in w/c- pt truly needs and meets criteria for a standing w/c- due to 1. Severe buttock and back pain- that's better with stander in therapy- that makes her take high doses of opiates-- 2. also due to difficulty with bowel program and constipation-  pt has severe difficulty, no matter how much she does bowel program and increasing oral bowel meds. 3. To better control spasticity- this will allow us , hopefully to avoid an ITB pump- which costs tens of thousands of dollars just to place (50k and up)- much less to manage- to fill costs $3-5k each time; so getting a standing w/c will cost the insurance MUCH less to control her spasticity.  4. Also has a bent back on current chair and as stated above, she's gained over 80 lbs since got the w/c- which is extremely common in patients requiring a power w/c.  5 Joystick is also broken- - needs a power w/c- due to management of suprapubic-  as well as needs elevator to get in and out of bed-  with transfer board; 6. Recline for suprapubic as well as tilt in space for pressure relief- 7. Suggest that pt gets custom back due to severe back pain as stated above as well as Stage I pressure ulcers frequently- that has to move constantly to prevent, which increases pain.  8. Patient is a complete paraplegic-  so meets criteria for Level III power w/c.   Also needs a new W/C cushion- ROHO 4 chamber- is 58 years old and worn- needs a new w/c cushion.  Has hx of skin  breakdown on lateral leg- pressure ulcer- so needs and meets criteria for ROHO.    Will send Rx to [facility]- will check with Selinda where to send Rx-  wrote written Rx-  4. Can try CBD if NO THC! Is legal here- but NO THC!   5.  Can try VR! We discussed this.  If needs Rx for this- let me know.   6. Hasn't gotten Refill of Oxycodone  since 7/24- doesn't need refills  7. Last refill on methadone  7/18/2/5- will do refill- almost due   8.  Trp injections weren't helpful- so not interested in them again.    9. F/U in 6 weeks as already ordered- SCI   10. Wanted you to see Dr Claudene or Dr Katrina? Call and see if can see them- at [facility].

## 2024-06-25 ENCOUNTER — Ambulatory Visit: Admitting: Orthopedic Surgery

## 2024-07-09 ENCOUNTER — Other Ambulatory Visit: Payer: Self-pay | Admitting: Physical Medicine and Rehabilitation

## 2024-07-16 ENCOUNTER — Telehealth: Payer: Self-pay

## 2024-07-16 NOTE — Telephone Encounter (Signed)
(  Dr. Cornelio is out of the office).   Angela Gilmore will have the wheelchair delivered this week. She wanted to know if you will order a bone density test? Or should she check with her PCP?   Call back phone [phone].

## 2024-08-16 ENCOUNTER — Encounter: Payer: Self-pay | Admitting: Physical Medicine and Rehabilitation

## 2024-08-16 ENCOUNTER — Encounter: Attending: Physical Medicine and Rehabilitation | Admitting: Physical Medicine and Rehabilitation

## 2024-08-16 VITALS — BP 100/68 | HR 93 | Ht 66.0 in

## 2024-08-16 DIAGNOSIS — G894 Chronic pain syndrome: Secondary | ICD-10-CM | POA: Insufficient documentation

## 2024-08-16 DIAGNOSIS — D492 Neoplasm of unspecified behavior of bone, soft tissue, and skin: Secondary | ICD-10-CM | POA: Insufficient documentation

## 2024-08-16 DIAGNOSIS — G8220 Paraplegia, unspecified: Secondary | ICD-10-CM | POA: Diagnosis present

## 2024-08-16 DIAGNOSIS — R252 Cramp and spasm: Secondary | ICD-10-CM | POA: Diagnosis present

## 2024-08-16 DIAGNOSIS — S24102S Unspecified injury at T2-T6 level of thoracic spinal cord, sequela: Secondary | ICD-10-CM | POA: Insufficient documentation

## 2024-08-16 DIAGNOSIS — S22049S Unspecified fracture of fourth thoracic vertebra, sequela: Secondary | ICD-10-CM | POA: Insufficient documentation

## 2024-08-16 DIAGNOSIS — Z993 Dependence on wheelchair: Secondary | ICD-10-CM | POA: Insufficient documentation

## 2024-08-16 NOTE — Patient Instructions (Signed)
 Plan: Needs to be checked by PCP, or Emergency room for probable PE- pulmonary embolism-  we have to rule out PE - on Midodrine for Low BP-  could also be due to dropping BP/HR with Metoprolol. Esp with chest pain tachycardia, and sats 93% on RA- and feels really bad and Shortness of breath.  - needs to go to ER.   2.   We discussed risk of fracture- more in standing w/c- but have to think about risk/benefit ratio- basically, at risk, due to  T score being -3.0-  which is osteoporosis.   3.  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 Consider postcontrast MRI for further evaluation, if not previously characterized.  4. Otherwise compared to outside MRI 03/11/2023, similar overall appearance of the thoracic spine, including marked cord atrophy and distortion from T1 to T6 levels, presumably sequela of prior injury.   5.  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    6. Seeing Dr Smith for Estim/TENS in neck?- for pain control   7.  Needs PRAFO's- B/L with kickstand-  also needs B/L TEDs- wrote for 3 pairs. To knee B/L for B/L foot drop and orthostatic hypotension.  Getitng PRAFOs due to losing ROM of ankles and feet- need to wear nightly to help reduce this loss of ROM.   8. Will cal if needs refills.   9. F/U in m3onths double appt- SCI-   I spent a total of 53   minutes on total care today- >50% coordination of care- due to d/w pt about BP, my concern she has a PE even though on Eliquis- with CP, SOB, tachycardia, LE edea R>L, as well as writing Rx for PRAFOs, referral to [facility]- and went over MRI again with pt.

## 2024-08-16 NOTE — Progress Notes (Signed)
 Subjective:    Patient ID: Angela Gilmore, female    DOB: 06-03-1966, 58 y.o.   MRN: [id number]  HPI  Pain Inventory Average Pain 9 Pain Right Now 8 My pain is constant, burning, and tingling  In the last 24 hours, has pain interfered with the following? General activity 10 Relation with others 9 Enjoyment of life 10 What TIME of day is your pain at its worst? morning , daytime, and evening Sleep (in general) Good  Pain is worse with: some activites Pain improves with: rest and medication Relief from Meds: 0  Family History  Problem Relation Age of Onset   Heart disease Father    Emphysema Father    Breast cancer Sister    Mental illness Sister    Stroke Brother    Mental illness Brother    Social History   Socioeconomic History   Marital status: Married    Spouse name: Not on file   Number of children: Not on file   Years of education: Not on file   Highest education level: Not on file  Occupational History   Not on file  Tobacco Use   Smoking status: Former   Smokeless tobacco: Never  Vaping Use   Vaping status: Never Used  Substance and Sexual Activity   Alcohol use: Not Currently   Drug use: Never   Sexual activity: Not on file  Other Topics Concern   Not on file  Social History Narrative   Not on file   Social Drivers of Health   Financial Resource Strain: Patient Declined (04/11/2024)   Received from [facility]   Overall Financial Resource Strain (CARDIA)    Difficulty of Paying Living Expenses: Patient declined  Food Insecurity: Low Risk  (05/15/2024)   Received from [facility]   Hunger Vital Sign    Within the past 12 months, you worried that your food would run out before you got money to buy more: Never true    Within the past 12 months, the food you bought just didn't last and you didn't have money to get more. : Never true  Transportation Needs: No Transportation Needs (05/15/2024)   Received from [employer]    In the past 12 months, has lack of reliable transportation kept you from medical appointments, meetings, work or from getting things needed for daily living? : No  Physical Activity: Unknown (04/11/2024)   Received from [facility]   Exercise Vital Sign    On average, how many days per week do you engage in moderate to strenuous exercise (like a brisk walk)?: 0 days    Minutes of Exercise per Session: Not on file  Stress: Patient Declined (04/11/2024)   Received from [facility] - Occupational Stress Questionnaire    Feeling of Stress : Patient declined  Social Connections: Socially Isolated (04/11/2024)   Received from [facility]   Social Network    How would you rate your social network (family, work, friends)?: Little participation, lonely and socially isolated   Past Surgical History:  Procedure Laterality Date   ABDOMINAL SURGERY     BREAST LUMPECTOMY     CARPAL TUNNEL RELEASE     HIP ARTHROSCOPY W/ LABRAL REPAIR     KNEE ARTHROPLASTY     PARTIAL HYSTERECTOMY     ROTATOR CUFF REPAIR     SPINAL FIXATION SURGERY     THORACOTOMY     TONSILECTOMY/ADENOIDECTOMY  WITH MYRINGOTOMY     TRIGGER FINGER RELEASE     WISDOM TOOTH EXTRACTION     Past Surgical History:  Procedure Laterality Date   ABDOMINAL SURGERY     BREAST LUMPECTOMY     CARPAL TUNNEL RELEASE     HIP ARTHROSCOPY W/ LABRAL REPAIR     KNEE ARTHROPLASTY     PARTIAL HYSTERECTOMY     ROTATOR CUFF REPAIR     SPINAL FIXATION SURGERY     THORACOTOMY     TONSILECTOMY/ADENOIDECTOMY WITH MYRINGOTOMY     TRIGGER FINGER RELEASE     WISDOM TOOTH EXTRACTION     Past Medical History:  Diagnosis Date   Hypoglycemia    Paraplegia (HCC)    BP 100/68   Pulse 93   Ht 5' 6 (1.676 m)   SpO2 96%   BMI 39.38 kg/m   Opioid Risk Score:   Fall Risk Score:  `1  Depression screen [facility] 2/9     08/16/2024    2:11 PM 06/21/2024    9:28 AM 04/29/2024    3:50 PM  11/03/2023   11:32 AM 08/30/2023   11:04 AM 04/26/2023   10:07 AM 01/23/2023   10:04 AM  Depression screen PHQ 2/9  Decreased Interest 3 1 3 1  0 1 1  Down, Depressed, Hopeless 3 1 3 1  0 1 1  PHQ - 2 Score 6 2 6 2  0 2 2  Altered sleeping   0      Tired, decreased energy   3      Change in appetite   0      Feeling bad or failure about yourself    2      Trouble concentrating   0      Moving slowly or fidgety/restless   0      Suicidal thoughts   0      PHQ-9 Score   11      Difficult doing work/chores   Extremely dIfficult         Review of Systems  Musculoskeletal:  Positive for myalgias.  Neurological:  Positive for numbness.       Tingling  Psychiatric/Behavioral:  The patient is nervous/anxious.   All other systems reviewed and are negative.      Objective:   Physical Exam        Assessment & Plan:

## 2024-08-16 NOTE — Progress Notes (Signed)
 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 With increased nerve pain.  Biggest limiter is nerve pain. Also tachycardic; Long COVID- new dx.  Pt is here for f/u on Nerve pain and SCI.  Hasn't been feeling well this Am at all-  Stays laid back all day to get off butt- pain. Heart rate sped up.  Felt bad brushing teeth - didn't vomit.   3 days ago- HR got into 150's- felt like would vomit.  Started back 5 days ago.    Legs and knees  swollen.  And if lays down, L knee is huge.  Same leg that had the blood clot in-   Swelling stayed over time, but never been as big as it is right now.   Having chest pain-  legs swollen - and  heart rate is really up- 100-up to 150 lately. And feels bad- last 5 days ago.   Still on Elqiuis- 5 mg BID-  one episode of this feeling yucky badly.  08/28/24- To put TENS unit in neck-  Doesn't want it done until feeling better.   Needs PCP appt to 08/2024.     Not sure if has a PRAFO match  or not-  T12-L1- has a nodule- has grown 1 mm, since last MRI  Has grown- were tiny and now bigger.  Done 06/17/24 (05/08/24- but uploaded 06/17/24) -  from 03/11/23-  Ordered by surgeon who wants to do surgery on cervical spine.   Did a bone density scan-  T score 3.0- I agree standing w/c is not the best idea.   Still taking Midodrine  10 mg BID 5:30 and  Cardiologist having her take Metoprolol in mid afternoon- and reduced Midodrine to BID from TID.   - 4 weeks ago- Had a fall- worst one she had- still sore in back where was bruised so back and behind a knee- was helping grand daughter clean bedroom- arm rest popped up- and fell out of the left side of w/c. Out of w/c completely.   Exam: Awake, alert appropriate, in recline with w/c- HR up to 111- and BP 100/68- somewhat variable- 86-111 since here.  Mild tachypnea- in low-mid 20's  Mildly tachycardic on  exam CTA b/L- but had reduced Breath sounds- throughout, but no W/R/R Moderate swelling of RLE- esp R knee- 1+ RLE edema to knee Wearing tight TEDs to knees Bowing- valgus deformity of knee to ankle- with more rotation/more supination of R foot than L foot- both valgus, but better on L.    saw back bruising around scapula- on pic  Mild bruising back of Back of L knee on picture  Plan: Needs to be checked by PCP, or Emergency room for probable PE- pulmonary embolism-  we have to rule out PE - on Midodrine for Low BP-  could also be due to dropping BP/HR with Metoprolol. Esp with chest pain tachycardia, and sats 93% on RA- and feels really bad and Shortness of breath.  - needs to go to ER.   2.   We discussed risk of fracture- more in standing w/c- but have to think about risk/benefit ratio- basically, at risk, due to  T score being -3.0-  which is osteoporosis.   3.  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 Consider postcontrast MRI for further evaluation, if not previously characterized.  4. Otherwise compared to outside MRI 03/11/2023, similar overall appearance of the thoracic spine, including marked cord atrophy and distortion from T1 to T6 levels, presumably sequela of prior injury.   5.  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    6. Seeing Dr Smith for Estim/TENS in neck?- for pain control   7.  Needs PRAFO's- B/L with kickstand-  also needs B/L TEDs- wrote for 3 pairs. To knee B/L for B/L foot drop and orthostatic hypotension.  Getitng PRAFOs due to losing ROM of ankles and feet- need to wear nightly to help reduce this loss of ROM.   8. Will cal if needs refills.   9. F/U in m3onths double appt- SCI-   10 Down to 5 mg BID of methadone  of note-  is slowly weaning.   I spent a total of 53   minutes on total care today- >50% coordination of care- due to d/w pt about BP, my concern she has a PE even though on Eliquis- with CP, SOB, tachycardia, LE edea R>L, as well as writing Rx for PRAFOs, referral to [facility]- and went over MRI again with pt.

## 2024-09-09 NOTE — Progress Notes (Signed)
 Referring Physician:  Lovorn, Megan, MD [phone] 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  She has no lower extremity sensation or function.  She unfortunately does have severe buttocks region pain which is likely referred given her history.  She previously had a thoracic spinal cord stimulator which understandably did not give her any relief.  Conservative measures:  Physical therapy: has participated in [facility] 08/28/2023 Multimodal medical therapy including regular antiinflammatories: Baclofen , Gabapentin , Cymblata, Oxycodone   Injections:  yes epidural steroid injections  Past Surgery: 2020--T1-T7 fusion  The symptoms are causing a significant impact on the patient's life.   I have utilized the care everywhere function in epic to review the outside records available from external health systems.  Review of Systems:  A 10 point review of systems is negative, except for the pertinent positives and negatives detailed in the HPI.  Past Medical History: Past Medical History:  Diagnosis Date   Hypoglycemia    Paraplegia (HCC)     Past Surgical History: Past Surgical History:  Procedure Laterality Date   ABDOMINAL SURGERY     BREAST LUMPECTOMY     CARPAL TUNNEL RELEASE     HIP ARTHROSCOPY W/ LABRAL REPAIR     KNEE ARTHROPLASTY     PARTIAL HYSTERECTOMY     ROTATOR CUFF REPAIR     SPINAL FIXATION SURGERY     THORACOTOMY     TONSILECTOMY/ADENOIDECTOMY WITH MYRINGOTOMY     TRIGGER FINGER RELEASE     WISDOM TOOTH EXTRACTION      Allergies: Allergies as of 09/16/2024 - Review Complete 08/16/2024  Allergen Reaction Noted    Penicillins Hives, Itching, and Rash 02/16/2005   Penicillin g Rash 03/22/2018    Medications:  Current Outpatient Medications:    apixaban (ELIQUIS) 5 MG TABS tablet, 1 tablet Orally Twice a day for 30 days, Disp: , Rfl:    baclofen  (LIORESAL ) 20 MG tablet, Take 1 tablet (20 mg total) by mouth 4 (four) times daily., Disp: 120 tablet, Rfl: 5   Cholecalciferol (VITAMIN D-3) 25 MCG (1000 UT) CAPS, Take 3,000 Units by mouth daily., Disp: , Rfl:    D-MANNOSE PO, Take by mouth daily at 6 (six) AM. 2 tablets once per day, Disp: , Rfl:    diazepam  (VALIUM ) 5 MG tablet, TAKE 1 TABLET (5 MG TOTAL) BY MOUTH IN THE MORNING AND AT BEDTIME. FOR SPASTICITY WITH BACLOFEN , Disp: 180 tablet, Rfl: 1   doxycycline (VIBRA-TABS) 100 MG tablet, , Disp: , Rfl:    DULoxetine  (CYMBALTA ) 60 MG capsule, TAKE 2 CAPSULES (120 MG TOTAL) BY MOUTH AT BEDTIME, Disp: 180 capsule, Rfl: 3   gabapentin  (NEURONTIN ) 600 MG tablet, TAKE 1 TABLET (600 MG TOTAL) BY MOUTH 4 (FOUR) TIMES DAILY. X 1 WEEK, THEN INCREASE TO 1200 MG 3X/DAY- FOR NERVE PAIN- DUE TO PARAPLEGIA, Disp: 180 tablet, Rfl: 5   Lorcaserin HCl 10 MG TABS, Take one daily for a week , then take twice daily, Disp: , Rfl:    methadone  (DOLOPHINE ) 10 MG tablet, Take 1.5 tablets (15 mg total) by mouth every 12 (twelve) hours. 05/29/24- can not fill before this date, Disp: 90  tablet, Rfl: 0   methadone  (DOLOPHINE ) 10 MG tablet, Take 1.5 tablets (15 mg total) by mouth every 12 (twelve) hours. For nerve pain, Disp: 90 tablet, Rfl: 0   metoprolol succinate (TOPROL-XL) 25 MG 24 hr tablet, 1 tablet Orally Once a day, Disp: , Rfl:    midodrine (PROAMATINE) 10 MG tablet, 10 mg 2 (two) times daily., Disp: , Rfl:    minocycline (MINOCIN) 100 MG capsule, Take by mouth daily., Disp: , Rfl:    naloxone  (NARCAN ) nasal spray 4 mg/0.1 mL, Take if accidental opioid overdose-, Disp: 1 each, Rfl: 5   oxyCODONE  (ROXICODONE ) 5 MG immediate release tablet, Take 1 tablet (5 mg total) by mouth every  6 (six) hours as needed for breakthrough pain., Disp: 120 tablet, Rfl: 0   pantoprazole (PROTONIX) 40 MG tablet, 1 tablet Orally Once a day, Disp: , Rfl:    Polyethylene Glycol 3350 (MIRALAX PO), MiraLax 17 gram, Disp: , Rfl:    senna-docusate (SENOKOT-S) 8.6-50 MG tablet, Take by mouth., Disp: , Rfl:   Social History: Social History   Tobacco Use   Smoking status: Former   Smokeless tobacco: Never  [employer]   Vaping status: Never Used  Substance Use Topics   Alcohol use: Not Currently   Drug use: Never    Family Medical History: Family History  Problem Relation Age of Onset   Heart disease Father    Emphysema Father    Breast cancer Sister    Mental illness Sister    Stroke Brother    Mental illness Brother     Physical Examination: Vitals:   09/16/24 1500  BP: 104/76    General: Patient is in no apparent distress. Attention to examination is appropriate.  Neck:   Supple.  Full range of motion.  Respiratory: Patient is breathing without any difficulty.   NEUROLOGICAL:     Awake, alert, oriented to person, place, and time.  Speech is clear and fluent.   Cranial Nerves: Pupils equal round and reactive to light.  Facial tone is symmetric.  Facial sensation is symmetric. Shoulder shrug is symmetric. Tongue protrusion is midline.    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  It does not show any overt compression.    Medical Decision Making/Assessment and Plan: Angela Gilmore is a pleasant 58 y.o. female with complicated past medical history including a thoracic region spinal cord injury which is neurologically complete.  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  But given its size on appearance unlikely to be symptomatic.  Her main symptom that she is concerned about today is burning in her buttocks region.  She has had this annually and has not had any relief.  She has previously had a thoracic spinal cord stimulator which understandably did not give her any symptom control.  She did have discussion about possible cervical region stimulator.  I did reach out to a another provider who specializes in pain surgical interventions and he let me know that the cervical spinal cord stimulator would be unlikely to give her buttocks region pain control.  This was my understanding as well and  what I expressed to the patient.  At this point I do not know a good surgical intervention for her current pain syndrome.  I do not feel that this is secondary to the nerve sheath tumors and this can continue to be followed with interval imaging.  Thank you for involving me in the care of this patient.    Penne MICAEL Sharps MD/MSCR Neurosurgery

## 2024-09-16 ENCOUNTER — Ambulatory Visit: Admitting: Neurosurgery

## 2024-09-16 ENCOUNTER — Encounter: Payer: Self-pay | Admitting: Neurosurgery

## 2024-09-16 VITALS — BP 104/76 | Ht 67.0 in | Wt 210.0 lb

## 2024-09-16 DIAGNOSIS — D492 Neoplasm of unspecified behavior of bone, soft tissue, and skin: Secondary | ICD-10-CM

## 2024-09-16 DIAGNOSIS — S22039S Unspecified fracture of third thoracic vertebra, sequela: Secondary | ICD-10-CM

## 2024-09-16 DIAGNOSIS — S24102S Unspecified injury at T2-T6 level of thoracic spinal cord, sequela: Secondary | ICD-10-CM

## 2024-10-11 ENCOUNTER — Telehealth: Payer: Self-pay | Admitting: Physical Medicine and Rehabilitation

## 2024-10-11 MED ORDER — METHADONE HCL 10 MG PO TABS: 15.0000 mg | ORAL_TABLET | Freq: Two times a day (BID) | ORAL | 0 refills | Status: AC

## 2024-10-11 MED ORDER — DIAZEPAM 5 MG PO TABS: 5.0000 mg | ORAL_TABLET | Freq: Two times a day (BID) | ORAL | 1 refills | Status: AC

## 2024-10-11 NOTE — Addendum Note (Signed)
 Addended by: Navarre Diana on: 10/11/2024 02:55 PM   Modules accepted: Orders

## 2024-10-11 NOTE — Telephone Encounter (Signed)
 Patient needs refill on methadone  and valium .

## 2024-10-14 ENCOUNTER — Encounter: Admitting: Physical Medicine and Rehabilitation

## 2024-10-22 ENCOUNTER — Other Ambulatory Visit: Payer: Self-pay | Admitting: Physical Medicine and Rehabilitation

## 2024-12-03 ENCOUNTER — Encounter: Payer: Self-pay | Admitting: Physical Medicine and Rehabilitation

## 2024-12-06 ENCOUNTER — Encounter: Payer: Self-pay | Admitting: Physical Medicine and Rehabilitation

## 2024-12-13 ENCOUNTER — Ambulatory Visit: Admitting: Physical Medicine and Rehabilitation

## 2025-01-06 ENCOUNTER — Encounter: Admitting: Physical Medicine and Rehabilitation

## 2025-03-07 ENCOUNTER — Encounter: Admitting: Physical Medicine and Rehabilitation

## 2025-05-02 ENCOUNTER — Encounter: Admitting: Physical Medicine and Rehabilitation
